# Patient Record
Sex: Male | Born: 1955 | Race: White | Hispanic: No | Marital: Single | Smoking: Never smoker
Health system: Southern US, Community
[De-identification: ages and names within clinical notes are randomized; demographics above are authoritative.]

## PROBLEM LIST (undated history)

## (undated) DIAGNOSIS — I2699 Other pulmonary embolism without acute cor pulmonale: Secondary | ICD-10-CM

## (undated) DIAGNOSIS — F79 Unspecified intellectual disabilities: Secondary | ICD-10-CM

## (undated) DIAGNOSIS — J45909 Unspecified asthma, uncomplicated: Secondary | ICD-10-CM

## (undated) DIAGNOSIS — F209 Schizophrenia, unspecified: Secondary | ICD-10-CM

---

## 2015-02-14 ENCOUNTER — Emergency Department (HOSPITAL_COMMUNITY): Payer: Medicare Other

## 2015-02-14 ENCOUNTER — Encounter (HOSPITAL_COMMUNITY): Payer: Self-pay | Admitting: *Deleted

## 2015-02-14 ENCOUNTER — Inpatient Hospital Stay (HOSPITAL_COMMUNITY)
Admission: EM | Admit: 2015-02-14 | Discharge: 2015-02-18 | DRG: 178 | Disposition: A | Discharge status: Home / Self Care | Payer: Medicare Other | Attending: Internal Medicine | Admitting: Internal Medicine

## 2015-02-14 DIAGNOSIS — F79 Unspecified intellectual disabilities: Secondary | ICD-10-CM | POA: Diagnosis present

## 2015-02-14 DIAGNOSIS — Z888 Allergy status to other drugs, medicaments and biological substances status: Secondary | ICD-10-CM | POA: Diagnosis not present

## 2015-02-14 DIAGNOSIS — Z86711 Personal history of pulmonary embolism: Secondary | ICD-10-CM | POA: Diagnosis not present

## 2015-02-14 DIAGNOSIS — Z7901 Long term (current) use of anticoagulants: Secondary | ICD-10-CM

## 2015-02-14 DIAGNOSIS — R4701 Aphasia: Secondary | ICD-10-CM | POA: Diagnosis present

## 2015-02-14 DIAGNOSIS — J189 Pneumonia, unspecified organism: Secondary | ICD-10-CM | POA: Diagnosis not present

## 2015-02-14 DIAGNOSIS — F209 Schizophrenia, unspecified: Secondary | ICD-10-CM | POA: Diagnosis present

## 2015-02-14 DIAGNOSIS — R531 Weakness: Secondary | ICD-10-CM | POA: Insufficient documentation

## 2015-02-14 DIAGNOSIS — I4581 Long QT syndrome: Secondary | ICD-10-CM | POA: Diagnosis present

## 2015-02-14 DIAGNOSIS — N179 Acute kidney failure, unspecified: Secondary | ICD-10-CM | POA: Diagnosis present

## 2015-02-14 DIAGNOSIS — I2699 Other pulmonary embolism without acute cor pulmonale: Secondary | ICD-10-CM | POA: Diagnosis present

## 2015-02-14 DIAGNOSIS — R471 Dysarthria and anarthria: Secondary | ICD-10-CM | POA: Diagnosis present

## 2015-02-14 DIAGNOSIS — R4781 Slurred speech: Secondary | ICD-10-CM | POA: Diagnosis present

## 2015-02-14 DIAGNOSIS — I714 Abdominal aortic aneurysm, without rupture, unspecified: Secondary | ICD-10-CM

## 2015-02-14 DIAGNOSIS — J45909 Unspecified asthma, uncomplicated: Secondary | ICD-10-CM | POA: Diagnosis present

## 2015-02-14 DIAGNOSIS — N200 Calculus of kidney: Secondary | ICD-10-CM | POA: Diagnosis present

## 2015-02-14 DIAGNOSIS — R109 Unspecified abdominal pain: Secondary | ICD-10-CM | POA: Diagnosis present

## 2015-02-14 DIAGNOSIS — Z885 Allergy status to narcotic agent status: Secondary | ICD-10-CM | POA: Diagnosis not present

## 2015-02-14 DIAGNOSIS — J4520 Mild intermittent asthma, uncomplicated: Secondary | ICD-10-CM

## 2015-02-14 DIAGNOSIS — J690 Pneumonitis due to inhalation of food and vomit: Principal | ICD-10-CM | POA: Diagnosis present

## 2015-02-14 DIAGNOSIS — M6289 Other specified disorders of muscle: Secondary | ICD-10-CM | POA: Diagnosis not present

## 2015-02-14 HISTORY — DX: Unspecified intellectual disabilities: F79

## 2015-02-14 HISTORY — DX: Schizophrenia, unspecified: F20.9

## 2015-02-14 HISTORY — DX: Other pulmonary embolism without acute cor pulmonale: I26.99

## 2015-02-14 HISTORY — DX: Unspecified asthma, uncomplicated: J45.909

## 2015-02-14 LAB — DIFFERENTIAL
Basophils Absolute: 0 10*3/uL (ref 0.0–0.1)
Basophils Relative: 0 %
EOS PCT: 1 %
Eosinophils Absolute: 0.1 10*3/uL (ref 0.0–0.7)
LYMPHS PCT: 21 %
Lymphs Abs: 1.6 10*3/uL (ref 0.7–4.0)
MONO ABS: 1 10*3/uL (ref 0.1–1.0)
MONOS PCT: 13 %
Neutro Abs: 5 10*3/uL (ref 1.7–7.7)
Neutrophils Relative %: 65 %

## 2015-02-14 LAB — RAPID URINE DRUG SCREEN, HOSP PERFORMED
Amphetamines: NOT DETECTED
Barbiturates: NOT DETECTED
Benzodiazepines: POSITIVE — AB
Cocaine: NOT DETECTED
OPIATES: NOT DETECTED
Tetrahydrocannabinol: NOT DETECTED

## 2015-02-14 LAB — CBC
HEMATOCRIT: 38.9 % — AB (ref 39.0–52.0)
Hemoglobin: 12.5 g/dL — ABNORMAL LOW (ref 13.0–17.0)
MCH: 29.4 pg (ref 26.0–34.0)
MCHC: 32.1 g/dL (ref 30.0–36.0)
MCV: 91.5 fL (ref 78.0–100.0)
PLATELETS: 267 10*3/uL (ref 150–400)
RBC: 4.25 MIL/uL (ref 4.22–5.81)
RDW: 14.2 % (ref 11.5–15.5)
WBC: 7.6 10*3/uL (ref 4.0–10.5)

## 2015-02-14 LAB — COMPREHENSIVE METABOLIC PANEL
ALK PHOS: 96 U/L (ref 38–126)
ALT: 10 U/L — ABNORMAL LOW (ref 17–63)
ANION GAP: 8 (ref 5–15)
AST: 16 U/L (ref 15–41)
Albumin: 2.8 g/dL — ABNORMAL LOW (ref 3.5–5.0)
BILIRUBIN TOTAL: 0.9 mg/dL (ref 0.3–1.2)
BUN: 6 mg/dL (ref 6–20)
CALCIUM: 9 mg/dL (ref 8.9–10.3)
CO2: 29 mmol/L (ref 22–32)
CREATININE: 1.33 mg/dL — AB (ref 0.61–1.24)
Chloride: 100 mmol/L — ABNORMAL LOW (ref 101–111)
GFR, EST NON AFRICAN AMERICAN: 57 mL/min — AB (ref >60)
Glucose, Bld: 116 mg/dL — ABNORMAL HIGH (ref 65–99)
Potassium: 3.5 mmol/L (ref 3.5–5.1)
SODIUM: 137 mmol/L (ref 135–145)
TOTAL PROTEIN: 6.9 g/dL (ref 6.5–8.1)

## 2015-02-14 LAB — URINALYSIS, ROUTINE W REFLEX MICROSCOPIC
Glucose, UA: NEGATIVE mg/dL
Ketones, ur: NEGATIVE mg/dL
LEUKOCYTES UA: NEGATIVE
NITRITE: NEGATIVE
PROTEIN: NEGATIVE mg/dL
SPECIFIC GRAVITY, URINE: 1.031 — AB (ref 1.005–1.030)
UROBILINOGEN UA: 2 mg/dL — AB (ref 0.0–1.0)
pH: 6.5 (ref 5.0–8.0)

## 2015-02-14 LAB — I-STAT TROPONIN, ED: TROPONIN I, POC: 0 ng/mL (ref 0.00–0.08)

## 2015-02-14 LAB — I-STAT CG4 LACTIC ACID, ED
Lactic Acid, Venous: 1.2 mmol/L (ref 0.5–2.0)
Lactic Acid, Venous: 1.73 mmol/L (ref 0.5–2.0)

## 2015-02-14 LAB — URINE MICROSCOPIC-ADD ON

## 2015-02-14 LAB — I-STAT CHEM 8, ED
BUN: 7 mg/dL (ref 6–20)
CALCIUM ION: 1.2 mmol/L (ref 1.12–1.23)
Chloride: 99 mmol/L — ABNORMAL LOW (ref 101–111)
Creatinine, Ser: 1.3 mg/dL — ABNORMAL HIGH (ref 0.61–1.24)
GLUCOSE: 115 mg/dL — AB (ref 65–99)
HCT: 40 % (ref 39.0–52.0)
Hemoglobin: 13.6 g/dL (ref 13.0–17.0)
Potassium: 3.7 mmol/L (ref 3.5–5.1)
SODIUM: 141 mmol/L (ref 135–145)
TCO2: 28 mmol/L (ref 0–100)

## 2015-02-14 LAB — PROTIME-INR
INR: 2.93 — ABNORMAL HIGH (ref 0.00–1.49)
PROTHROMBIN TIME: 30.1 s — AB (ref 11.6–15.2)

## 2015-02-14 LAB — ETHANOL

## 2015-02-14 LAB — APTT: aPTT: 46 seconds — ABNORMAL HIGH (ref 24–37)

## 2015-02-14 MED ORDER — PIPERACILLIN-TAZOBACTAM 3.375 G IVPB 30 MIN
3.3750 g | Freq: Once | INTRAVENOUS | Status: AC
  Administered 2015-02-14: 3.375 g via INTRAVENOUS
  Filled 2015-02-14: qty 50

## 2015-02-14 MED ORDER — VANCOMYCIN HCL IN DEXTROSE 1-5 GM/200ML-% IV SOLN
1000.0000 mg | Freq: Once | INTRAVENOUS | Status: AC
  Administered 2015-02-14: 1000 mg via INTRAVENOUS
  Filled 2015-02-14: qty 200

## 2015-02-14 MED ORDER — SODIUM CHLORIDE 0.9 % IV BOLUS (SEPSIS)
1000.0000 mL | Freq: Once | INTRAVENOUS | Status: AC
  Administered 2015-02-14: 1000 mL via INTRAVENOUS

## 2015-02-14 MED ORDER — IOHEXOL 350 MG/ML SOLN
100.0000 mL | Freq: Once | INTRAVENOUS | Status: AC | PRN
  Administered 2015-02-14: 100 mL via INTRAVENOUS

## 2015-02-14 NOTE — ED Notes (Signed)
Patient transported to CT 

## 2015-02-14 NOTE — ED Notes (Signed)
Attempted to call report

## 2015-02-14 NOTE — H&P (Signed)
[facility] History and Physical  Albert Donovan ZOX:[id number] DOB: [birth year] DOA: 02/14/2015  Referring physician: ED physician PCP: No primary care provider on file.  Specialists:   Chief Complaint: Right arm weakness, slurred speech, shortness of breath, abdominal pain, low  blood pressure  HPI: Albert Donovan is a 59 y.o. male with PMH of mental retardation, asthma, schizophrenia, PE, who presents with right arm weakness, slurred speech, shortness of breath, abdominal pain, low blood pressure.  Patient has mental retardation, and is unable to provide accurate medical history, therefore, most of the history is obtained by discussing the case with ED physician, his brother and group home supervisor, Ms. Laural Benes.  Per Mr. Laural Benes, patient was recently hospitalized to [facility] from 9/1-9/15 for aspiration pneumonia. Patient was treated with antibiotics, and discharged back to group home facility. Patient was supposed to continue to take doxycycline orally, but doctor forgot to prescribe it at discharge. He just got doxycycline prescription today and started taking oral doxycycline today. Per Mr. Laural Benes, pt still has shortness of breath during the past 7 days, but no chest pain or cough. No fever or chills.  Patient was also noted to have slurred speech, and right arm weakness in the past week. His slurred speech has been persistent in the past 7 days, but the right arm weakness has almost resolved now. He complains of abdominal pain in the past 2 days, which is located in both left and right upper quadrant, worse on the left upper quadrant. No diarrhea or constipation. No symptoms of UTI. Of note, patient had hx of PE, he was on Xarelto which was discontinued for 1 week during previous admission for thoracentesis. His Xarelto was restarted today.  In ED, patient was found to have an INR 2.93, PTT 46, lactate 1.73, WBC 7.6, tachycardia, AKI, normal temperature. Negative CT head of  acute abnormalities. CTA of chest showed no evidence of significant pulmonary embolus. No evidence of aortic dissection, but showed consolidation in the right lung base with small pleural effusion, possibly pneumonia. CT-abdomen/pelvis showed nonobstructing stone in the left kidney. Diffuse bladder wall thickening suggesting cystitis. No hydronephrosis. Urinalysis and urine culture pending. Patient was admitted to inpatient for further reevaluation and treatment.  Where does patient live?   Group home Can patient participate in ADLs?   None  Review of Systems: Could not be obtained due to mental retardation.  Allergy:  Allergies  Allergen Reactions  . Trifluoperazine Hcl   . Tramadol     Past Medical History  Diagnosis Date  . PE (pulmonary embolism)   . MR (mental retardation)   . Schizophrenia   . Asthma     History reviewed. No pertinent past surgical history.  Social History:  reports that he has never smoked. He does not have any smokeless tobacco history on file. His alcohol and drug histories are not on file.  Family History:  Family History  Problem Relation Age of Onset  . Stroke Mother   . Prostate cancer Father   . Deep vein thrombosis Brother      Prior to Admission medications   Not on File    Physical Exam: Filed Vitals:   02/14/15 2100 02/14/15 2200 02/14/15 2230 02/15/15 0000  BP: 110/71 102/54 102/55   Pulse: 99 93 100   Temp:      Resp: Height:    6' (1.829 m)  Weight:    122.925 kg (271 lb)  SpO2: 98% 97% 97%  General: Not in acute distress HEENT:       Eyes: PERRL, EOMI, no scleral icterus.       ENT: No discharge from the ears and nose, no pharynx injection, no tonsillar enlargement.        Neck: No JVD, no bruit, no mass felt. Heme: No neck lymph node enlargement. Cardiac: S1/S2, RRR, No murmurs, No gallops or rubs. Pulm: No rales, wheezing, rhonchi or rubs. Abd: Soft, nondistended, tenderness over LUQ, no rebound pain, no  organomegaly, BS present. Ext: No pitting leg edema bilaterally. 2+DP/PT pulse bilaterally. Musculoskeletal: No joint deformities, No joint redness or warmth, no limitation of ROM in spin. Skin: No rashes.  Neuro: Alert, not oriented X3, cranial nerves II-XII grossly intact, muscle strength 5/5 in all extremities, sensation to light touch intact. Brachial reflex 1+ bilaterally. Knee reflex 1+ bilaterally. Negative Babinski's sign. Normal finger to nose test. Psych: Patient is not psychotic, no suicidal or hemocidal ideation.  Labs on Admission:  Basic Metabolic Panel:  Recent Labs Lab 02/14/15 1951 02/14/15 2004  NA 141 137  K 3.7 3.5  CL 99* 100*  CO2  --  29  GLUCOSE 115* 116*  BUN 7 6  CREATININE 1.30* 1.33*  CALCIUM  --  9.0   Liver Function Tests:  Recent Labs Lab 02/14/15 2004  AST 16  ALT 10*  ALKPHOS 96  BILITOT 0.9  PROT 6.9  ALBUMIN 2.8*    Recent Labs Lab 02/15/15 0126  LIPASE 38   No results for input(s): AMMONIA in the last 168 hours. CBC:  Recent Labs Lab 02/14/15 1951 02/14/15 2004  WBC  --  7.6  NEUTROABS  --  5.0  HGB 13.6 12.5*  HCT 40.0 38.9*  MCV  --  91.5  PLT  --  267   Cardiac Enzymes: No results for input(s): CKTOTAL, CKMB, CKMBINDEX, TROPONINI in the last 168 hours.  BNP (last 3 results) No results for input(s): BNP in the last 8760 hours.  ProBNP (last 3 results) No results for input(s): PROBNP in the last 8760 hours.  CBG: No results for input(s): GLUCAP in the last 168 hours.  Radiological Exams on Admission: Ct Head Wo Contrast  02/14/2015   CLINICAL DATA:  59 yr old male with worsening slurred speech, intermittent right sided weakness, generalized fatigue for the past week, also elevated HR, low BP.  EXAM: CT HEAD WITHOUT CONTRAST  TECHNIQUE: Contiguous axial images were obtained from the base of the skull through the vertex without intravenous contrast.  COMPARISON:  None.  FINDINGS: Ventricles are normal  configuration. There is ventricular and sulcal enlargement reflecting volume loss somewhat greater than generally seen in this patient's age group. There is no hydrocephalus.  There are no parenchymal masses or mass effect. There is no evidence of a cortical infarct.  There are no extra-axial masses or abnormal fluid collections.  There is no intracranial hemorrhage.  Visualized sinuses and mastoid air cells are clear. No skull lesion.  IMPRESSION: 1. No acute intracranial abnormalities. 2. Volume loss somewhat greater than expected for age. No other abnormality.   Electronically Signed   By: Amie Portland M.D.   On: 02/14/2015 20:11   Ct Angio Chest Pe W/cm &/or Wo Cm  02/14/2015   CLINICAL DATA:  Abdominal pain radiating into the back.  EXAM: CT ANGIOGRAPHY CHEST  CT ABDOMEN AND PELVIS WITH CONTRAST  TECHNIQUE: Multidetector CT imaging of the chest was performed using the standard protocol during bolus administration of intravenous  contrast. Multiplanar CT image reconstructions and MIPs were obtained to evaluate the vascular anatomy. Multidetector CT imaging of the abdomen and pelvis was performed using the standard protocol during bolus administration of intravenous contrast.  CONTRAST:  OMNIPAQUE IOHEXOL 350 MG/ML SOLN  COMPARISON:  None.  FINDINGS: CTA CHEST FINDINGS  Technically adequate study with moderately good opacification of the central and proximal segmental pulmonary arteries. No focal filling defects demonstrated. No evidence of significant pulmonary embolus.  Normal heart size. Normal caliber thoracic aorta. No evidence of aortic dissection. Mild calcification in the aorta. Great vessel origins are patent. Mediastinal lymph nodes are not pathologically enlarged. Esophagus is decompressed. Small esophageal hiatal hernia.  Small right pleural effusion with basilar infiltration and volume loss, possibly pneumonia. Mild dependent changes in the left lung base. No pneumothorax.  CT ABDOMEN and  PELVIS FINDINGS  The liver, spleen, gallbladder, pancreas, adrenal glands, abdominal aorta, inferior vena cava, and retroperitoneal lymph nodes are unremarkable. Sub cm low-attenuation lesion in the right kidney probably represents a cyst although too small to characterize. 2 mm stone in the lower pole left kidney. No hydronephrosis or hydroureter. Stomach, small bowel, and colon are not abnormally distended. No free air or free fluid in the abdomen.  Pelvis: Appendix is not identified. Bladder wall is diffusely thickened possibly suggesting cystitis. Prostate gland is not enlarged. No free or loculated pelvic fluid collections. No pelvic mass or lymphadenopathy. No evidence of diverticulitis. Degenerative changes in the spine. No destructive bone lesions.  Review of the MIP images confirms the above findings.  IMPRESSION: No evidence of significant pulmonary embolus. No evidence of aortic dissection. Consolidation in the right lung base with small pleural effusion, possibly pneumonia.  Nonobstructing stone in the left kidney. Diffuse bladder wall thickening suggesting cystitis. No evidence of bowel obstruction or inflammation.   Electronically Signed   By: Burman Nieves M.D.   On: 02/14/2015 22:08   Ct Abdomen Pelvis W Contrast  02/14/2015   CLINICAL DATA:  Abdominal pain radiating into the back.  EXAM: CT ANGIOGRAPHY CHEST  CT ABDOMEN AND PELVIS WITH CONTRAST  TECHNIQUE: Multidetector CT imaging of the chest was performed using the standard protocol during bolus administration of intravenous contrast. Multiplanar CT image reconstructions and MIPs were obtained to evaluate the vascular anatomy. Multidetector CT imaging of the abdomen and pelvis was performed using the standard protocol during bolus administration of intravenous contrast.  CONTRAST:  OMNIPAQUE IOHEXOL 350 MG/ML SOLN  COMPARISON:  None.  FINDINGS: CTA CHEST FINDINGS  Technically adequate study with moderately good opacification of the  central and proximal segmental pulmonary arteries. No focal filling defects demonstrated. No evidence of significant pulmonary embolus.  Normal heart size. Normal caliber thoracic aorta. No evidence of aortic dissection. Mild calcification in the aorta. Great vessel origins are patent. Mediastinal lymph nodes are not pathologically enlarged. Esophagus is decompressed. Small esophageal hiatal hernia.  Small right pleural effusion with basilar infiltration and volume loss, possibly pneumonia. Mild dependent changes in the left lung base. No pneumothorax.  CT ABDOMEN and PELVIS FINDINGS  The liver, spleen, gallbladder, pancreas, adrenal glands, abdominal aorta, inferior vena cava, and retroperitoneal lymph nodes are unremarkable. Sub cm low-attenuation lesion in the right kidney probably represents a cyst although too small to characterize. 2 mm stone in the lower pole left kidney. No hydronephrosis or hydroureter. Stomach, small bowel, and colon are not abnormally distended. No free air or free fluid in the abdomen.  Pelvis: Appendix is not identified. Bladder wall  is diffusely thickened possibly suggesting cystitis. Prostate gland is not enlarged. No free or loculated pelvic fluid collections. No pelvic mass or lymphadenopathy. No evidence of diverticulitis. Degenerative changes in the spine. No destructive bone lesions.  Review of the MIP images confirms the above findings.  IMPRESSION: No evidence of significant pulmonary embolus. No evidence of aortic dissection. Consolidation in the right lung base with small pleural effusion, possibly pneumonia.  Nonobstructing stone in the left kidney. Diffuse bladder wall thickening suggesting cystitis. No evidence of bowel obstruction or inflammation.   Electronically Signed   By: Burman Nieves M.D.   On: 02/14/2015 22:08    EKG: Independently reviewed.  Abnormal findings:  QTC 616, low voltage, tachycardia, T-wave flattening Assessment/Plan Principal Problem:    Aspiration pneumonia Active Problems:   PE (pulmonary embolism)   MR (mental retardation)   Schizophrenia   Asthma   Weakness   Slurred speech   Abdominal pain   AKI (acute kidney injury)  Aspiration pneumonia: Patient was recently treated for aspiration pneumonia. He still has shortness of breath with positive consolidations on CTA. Patient is not septic on admission. hemadynamic is stable. He was supposed to continue to take doxycycline, but has not been taking unitl today due to mistake. - will admit to tele bed - ED started Vancomycin and Zosyn, will continue. May narrow down quickly if no signs of worsening. - Mucinex for cough  - Albuterol Neb prn for SOB - Urine legionella and S. pneumococcal antigen - Follow up blood culture x2, sputum culture and plus Flu pcr - will get Procalcitonin and trend lactic acid level - IVF: 1L of NS bolus in ED, followed by 100 mL per hour of NS - NPO until SLP done  PE (pulmonary embolism): No new PE by CTA - Continue Xarelto   MR (mental retardation) and schizophrenia: No acute issues. Stable. - no med. Observe closely  Slurred speech and right arm weakness: CT head is negative. Right arm weakness has almost resolved. Neurology was consulted. Dr. Cyril Mourning evaluated the patient. Recommend MRI. -Appreciate Dr. Cheral Marker consultation -f/u MRI-brain -start ASA  Abdominal pain: Etiology is not clear. CT abdomen/pelvis is not impressive, but showed possible cystitis. -Follow-up urinalysis and urine culture. -Check lipase -Patient is already on IV antibiotics -When necessary morphine for pain  AKI (acute kidney injury): Creatinine 1.33, BUN 6. No hydronephrosis by CT-abdomen/pelvis. Likely due to prerenal secondary to dehydration. - IVF as above - Check FeNa  - Follow up renal function by BMP   DVT ppx: on Xarelto Code Status: Full code Family Communication:  Yes, patient's brother and group home supervisor, Ms. Laural Benes at bed  side Disposition Plan: Admit to inpatient   Date of Service 02/15/2015    Lorretta Harp [facility] Pager [phone]  If 7PM-7AM, please contact night-coverage [email or web address] Password [facility] 02/15/2015, 1:43 AM

## 2015-02-14 NOTE — ED Notes (Signed)
Pt unable to provide urine specimen at this time

## 2015-02-14 NOTE — ED Provider Notes (Signed)
CSN: [id number]     Arrival date & time 02/14/15  1827 History   First MD Initiated Contact with Patient 02/14/15 1850     Chief Complaint  Patient presents with  . Aphasia  . Weakness     (Consider location/radiation/quality/duration/timing/severity/associated sxs/prior Treatment) The history is provided by the EMS personnel and the nursing home.  Lalo Tromp is a 59 y.o. male hx of mental retardation, Schizophrenia, pulmonary embolism, who presenting with fatigue, slurred speech, abdominal pain, shortness of breath. Patient was recently admitted to [facility] and was found to have bilateral pleural effusions and underwent thoracentesis. He also finished a course of antibiotics. Moreover, his xarelto was stopped for more than a week and just restarted today. He also had slurred speech upon discharge from the hospital on 9/15. He was sent back to the hospital and was told that he didn't have a stroke. His slurred speech was progressively worsening. Has intermittent shortness of breath and cough as well. Denies fevers. Has intermittent LLQ pain as well. Denies vomiting. Saw PMD today and was noted to be hypotensive 80/60. Difficult historian due to slurred speech.     Level V caveat- mental retardation, slurred speech   Past Medical History  Diagnosis Date  . PE (pulmonary embolism)   . MR (mental retardation)   . Schizophrenia   . Asthma    History reviewed. No pertinent past surgical history. History reviewed. No pertinent family history. Social History  Substance Use Topics  . Smoking status: Never Smoker   . Smokeless tobacco: None  . Alcohol Use: None    Review of Systems  Respiratory: Positive for cough and shortness of breath.   Gastrointestinal: Positive for abdominal pain.  Neurological: Positive for speech difficulty and weakness.  All other systems reviewed and are negative.     Allergies  Review of patient's allergies indicates not on file.  Home  Medications   Prior to Admission medications   Not on File   BP 110/71 mmHg  Pulse 99  Temp(Src) 98 F (36.7 C)  Resp 14  SpO2 98% Physical Exam  Constitutional:  Chronically ill, slurred speech   HENT:  Head: Normocephalic.  Mouth/Throat: Oropharynx is clear and moist.  Eyes: Conjunctivae are normal. Pupils are equal, round, and reactive to light.  Neck: Normal range of motion. Neck supple.  Cardiovascular: Regular rhythm and normal heart sounds.   Tachycardic   Pulmonary/Chest: Effort normal and breath sounds normal. No respiratory distress. He has no wheezes. He has no rales.  Abdominal: Soft. Bowel sounds are normal.  Minimal L sided ab tenderness   Musculoskeletal: Normal range of motion.  Neurological: He is alert.  Slurred speech. Nl strength throughout.   Skin: Skin is warm and dry.  Psychiatric: He has a normal mood and affect. His behavior is normal. Thought content normal.  Nursing note and vitals reviewed.   ED Course  Procedures (including critical care time) Labs Review Labs Reviewed  PROTIME-INR - Abnormal; Notable for the following:    Prothrombin Time 30.1 (*)    INR 2.93 (*)    All other components within normal limits  APTT - Abnormal; Notable for the following:    aPTT 46 (*)    All other components within normal limits  CBC - Abnormal; Notable for the following:    Hemoglobin 12.5 (*)    HCT 38.9 (*)    All other components within normal limits  COMPREHENSIVE METABOLIC PANEL - Abnormal; Notable for the following:  Chloride 100 (*)    Glucose, Bld 116 (*)    Creatinine, Ser 1.33 (*)    Albumin 2.8 (*)    ALT 10 (*)    GFR calc non Af Amer 57 (*)    All other components within normal limits  I-STAT CHEM 8, ED - Abnormal; Notable for the following:    Chloride 99 (*)    Creatinine, Ser 1.30 (*)    Glucose, Bld 115 (*)    All other components within normal limits  CULTURE, BLOOD (ROUTINE X 2)  CULTURE, BLOOD (ROUTINE X 2)  ETHANOL   DIFFERENTIAL  URINE RAPID DRUG SCREEN, HOSP PERFORMED  URINALYSIS, ROUTINE W REFLEX MICROSCOPIC (NOT AT [facility])  I-STAT TROPOININ, ED  I-STAT CG4 LACTIC ACID, ED  I-STAT CG4 LACTIC ACID, ED    Imaging Review Ct Head Wo Contrast  02/14/2015   CLINICAL DATA:  59 yr old male with worsening slurred speech, intermittent right sided weakness, generalized fatigue for the past week, also elevated HR, low BP.  EXAM: CT HEAD WITHOUT CONTRAST  TECHNIQUE: Contiguous axial images were obtained from the base of the skull through the vertex without intravenous contrast.  COMPARISON:  None.  FINDINGS: Ventricles are normal configuration. There is ventricular and sulcal enlargement reflecting volume loss somewhat greater than generally seen in this patient's age group. There is no hydrocephalus.  There are no parenchymal masses or mass effect. There is no evidence of a cortical infarct.  There are no extra-axial masses or abnormal fluid collections.  There is no intracranial hemorrhage.  Visualized sinuses and mastoid air cells are clear. No skull lesion.  IMPRESSION: 1. No acute intracranial abnormalities. 2. Volume loss somewhat greater than expected for age. No other abnormality.   Electronically Signed   By: Amie Portland M.D.   On: 02/14/2015 20:11   Ct Angio Chest Pe W/cm &/or Wo Cm  02/14/2015   CLINICAL DATA:  Abdominal pain radiating into the back.  EXAM: CT ANGIOGRAPHY CHEST  CT ABDOMEN AND PELVIS WITH CONTRAST  TECHNIQUE: Multidetector CT imaging of the chest was performed using the standard protocol during bolus administration of intravenous contrast. Multiplanar CT image reconstructions and MIPs were obtained to evaluate the vascular anatomy. Multidetector CT imaging of the abdomen and pelvis was performed using the standard protocol during bolus administration of intravenous contrast.  CONTRAST:  OMNIPAQUE IOHEXOL 350 MG/ML SOLN  COMPARISON:  None.  FINDINGS: CTA CHEST FINDINGS  Technically adequate  study with moderately good opacification of the central and proximal segmental pulmonary arteries. No focal filling defects demonstrated. No evidence of significant pulmonary embolus.  Normal heart size. Normal caliber thoracic aorta. No evidence of aortic dissection. Mild calcification in the aorta. Great vessel origins are patent. Mediastinal lymph nodes are not pathologically enlarged. Esophagus is decompressed. Small esophageal hiatal hernia.  Small right pleural effusion with basilar infiltration and volume loss, possibly pneumonia. Mild dependent changes in the left lung base. No pneumothorax.  CT ABDOMEN and PELVIS FINDINGS  The liver, spleen, gallbladder, pancreas, adrenal glands, abdominal aorta, inferior vena cava, and retroperitoneal lymph nodes are unremarkable. Sub cm low-attenuation lesion in the right kidney probably represents a cyst although too small to characterize. 2 mm stone in the lower pole left kidney. No hydronephrosis or hydroureter. Stomach, small bowel, and colon are not abnormally distended. No free air or free fluid in the abdomen.  Pelvis: Appendix is not identified. Bladder wall is diffusely thickened possibly suggesting cystitis. Prostate gland is not enlarged.  No free or loculated pelvic fluid collections. No pelvic mass or lymphadenopathy. No evidence of diverticulitis. Degenerative changes in the spine. No destructive bone lesions.  Review of the MIP images confirms the above findings.  IMPRESSION: No evidence of significant pulmonary embolus. No evidence of aortic dissection. Consolidation in the right lung base with small pleural effusion, possibly pneumonia.  Nonobstructing stone in the left kidney. Diffuse bladder wall thickening suggesting cystitis. No evidence of bowel obstruction or inflammation.   Electronically Signed   By: Burman Nieves M.D.   On: 02/14/2015 22:08   Ct Abdomen Pelvis W Contrast  02/14/2015   CLINICAL DATA:  Abdominal pain radiating into the back.   EXAM: CT ANGIOGRAPHY CHEST  CT ABDOMEN AND PELVIS WITH CONTRAST  TECHNIQUE: Multidetector CT imaging of the chest was performed using the standard protocol during bolus administration of intravenous contrast. Multiplanar CT image reconstructions and MIPs were obtained to evaluate the vascular anatomy. Multidetector CT imaging of the abdomen and pelvis was performed using the standard protocol during bolus administration of intravenous contrast.  CONTRAST:  OMNIPAQUE IOHEXOL 350 MG/ML SOLN  COMPARISON:  None.  FINDINGS: CTA CHEST FINDINGS  Technically adequate study with moderately good opacification of the central and proximal segmental pulmonary arteries. No focal filling defects demonstrated. No evidence of significant pulmonary embolus.  Normal heart size. Normal caliber thoracic aorta. No evidence of aortic dissection. Mild calcification in the aorta. Great vessel origins are patent. Mediastinal lymph nodes are not pathologically enlarged. Esophagus is decompressed. Small esophageal hiatal hernia.  Small right pleural effusion with basilar infiltration and volume loss, possibly pneumonia. Mild dependent changes in the left lung base. No pneumothorax.  CT ABDOMEN and PELVIS FINDINGS  The liver, spleen, gallbladder, pancreas, adrenal glands, abdominal aorta, inferior vena cava, and retroperitoneal lymph nodes are unremarkable. Sub cm low-attenuation lesion in the right kidney probably represents a cyst although too small to characterize. 2 mm stone in the lower pole left kidney. No hydronephrosis or hydroureter. Stomach, small bowel, and colon are not abnormally distended. No free air or free fluid in the abdomen.  Pelvis: Appendix is not identified. Bladder wall is diffusely thickened possibly suggesting cystitis. Prostate gland is not enlarged. No free or loculated pelvic fluid collections. No pelvic mass or lymphadenopathy. No evidence of diverticulitis. Degenerative changes in the spine. No destructive  bone lesions.  Review of the MIP images confirms the above findings.  IMPRESSION: No evidence of significant pulmonary embolus. No evidence of aortic dissection. Consolidation in the right lung base with small pleural effusion, possibly pneumonia.  Nonobstructing stone in the left kidney. Diffuse bladder wall thickening suggesting cystitis. No evidence of bowel obstruction or inflammation.   Electronically Signed   By: Burman Nieves M.D.   On: 02/14/2015 22:08   I have personally reviewed and evaluated these images and lab results as part of my medical decision-making.   EKG Interpretation   Date/Time:  Thursday February 14 2015 18:34:57 EDT Ventricular Rate:  129 PR Interval:    QRS Duration: 82 QT Interval:  408 QTC Calculation: 597 R Axis:   -38 Text Interpretation:  sinus tachycardia  Left axis deviation Low voltage  QRS Cannot rule out Anterior infarct , age undetermined Abnormal ECG No  previous ECGs available Confirmed by YAO  MD, DAVID ([id number]) on 02/14/2015  6:50:03 PM      MDM   Final diagnoses:  AAA (abdominal aortic aneurysm)    Petar Mucci is a 59 y.o. male here with  worsening slurred speech, shortness of breath, ab pain, intermittent hypotension. Consider sepsis from recurrent pneumonia vs UTI vs PE (patient off xarelto for more than a week) vs AAA vs stroke. Will get labs, CT angio chest/ab/pel, sepsis workup.   10:52 PM Patient fail swallow eval. CT head showed no acute infarct. Symptoms for a week, consulted neuro for further workup. CT showed pneumonia, no PE. Given IV abx for HCAP. CT ab/pel also showed possible cystitis. Will admit.     Richardean Canal, MD 02/14/15 [phone]

## 2015-02-14 NOTE — Consult Note (Signed)
Referring Physician: Dr Darl Householder    Chief Complaint: worsening dysarthria, intermittent right sided weakness   HPI:                                                                                                                                         Albert Donovan is an 59 y.o. male with a past medical history significant for MR, schizophrenia, asthma, PE, recently admitted to [facility] due to bilateral pleural effusions and underwent thoracentesis, lives at a nursing home, and was brought to this ED for evaluation of the above mentioned symptoms. He is a poor historian. Family is at the bedside and report that Albert Donovan was off xarelto for almost 2 weeks due to his recent surgical procedure and resumed taking it today. As per family, he has some slurred speech at baseline that has been getting progressively worse since discharge from [facility], to the point that family and nursing home staff can barely understand what he is trying to say. He was sent back to the hospital and was told that he didn't have a stroke Furthermore, he has been noted to have intermittent weakness of the right side. Patient denies HA, vertigo, double vision, or vision disturbances. No reported confusion or falls. Denies fevers. Has intermittent LLQ pain as well. CT brain performed tonight was personally reviewed and showed no acute abnormality. CTA chest: no PE, consolidation in the right lung base with small pleural effusion, possibly pneumonia. Serologies were reviewed: UDS negative, etoh  < 5, wbc 7.6, Cr 1.33, Na 137, lactic acid 1.20 ,PTT 46, INR 2.93  Date last known well: unable to determine Time last known well: unable to determine tPA Given: no, late presentation   Past Medical History  Diagnosis Date  . PE (pulmonary embolism)   . MR (mental retardation)   . Schizophrenia   . Asthma     History reviewed. No pertinent past surgical history.  History reviewed. No pertinent family history. Social History:  reports  that he has never smoked. He does not have any smokeless tobacco history on file. His alcohol and drug histories are not on file.  Allergies: Not on File  Medications:                                                                                                                           I have reviewed  the patient's current medications.  ROS:                                                                                                                                       History obtained from chart review and the patient  General ROS: negative for - chills, fever, night sweats,  or weight loss Psychological ROS: negative for -  memory difficulties, or suicidal ideation Ophthalmic ROS: negative for - blurry vision, double vision, eye pain or loss of vision ENT ROS: negative for - epistaxis, nasal discharge, oral lesions, sore throat, tinnitus or vertigo Allergy and Immunology ROS: negative for - hives or itchy/watery eyes Hematological and Lymphatic ROS: negative for - bleeding problems, bruising or swollen lymph nodes Endocrine ROS: negative for - galactorrhea, hair pattern changes, polydipsia/polyuria or temperature intolerance Respiratory ROS: negative for - cough, hemoptysis, or wheezing Cardiovascular ROS: negative for - chest pain, edema or irregular heartbeat Gastrointestinal ROS: negative for - diarrhea, hematemesis, or stool incontinence Genito-Urinary ROS: negative for - dysuria, hematuria, incontinence or urinary frequency/urgency Musculoskeletal ROS: negative for - joint swelling Neurological ROS: as noted in HPI Dermatological ROS: negative for rash and skin lesion changes   Physical exam:  Constitutional: well developed, pleasant male in no apparent distress. Blood pressure 110/71, pulse 99, temperature 98 F (36.7 C), resp. rate 14, SpO2 98 %. Eyes: no jaundice or exophthalmos.  Head: normocephalic. Neck: supple, no bruits, no JVD. Cardiac: no murmurs. Lungs:  clear. Abdomen: soft, no tender, no mass. Extremities: no edema, clubbing, or cyanosis.  Skin: no rash   Neurologic Examination:                                                                                                      General: NAD Mental Status: Alert, oriented, thought content appropriate.  Marked dysarthria without evidence of aphasia.  Able to follow 3 step commands without difficulty. Cranial Nerves: II: Discs flat bilaterally; Visual fields grossly normal, pupils equal, round, reactive to light and accommodation III,IV, VI: ptosis not present, extra-ocular motions intact bilaterally V,VII: smile symmetric, facial light touch sensation normal bilaterally VIII: hearing normal bilaterally IX,X: uvula rises symmetrically XI: bilateral shoulder shrug XII: midline tongue extension without atrophy or fasciculations  Motor: Right : Upper extremity   5/5    Left:     Upper extremity   5/5  Lower extremity   5/5     Lower extremity   5/5 Tone and bulk:normal tone throughout; no atrophy  noted Sensory: Pinprick and light touch intact throughout, bilaterally Deep Tendon Reflexes:  1+ all over Plantars: Right: downgoing   Left: downgoing Cerebellar: normal finger-to-nose,  normal heel-to-shin test Gait: No tested due to multiple leads t    Results for orders placed or performed during the hospital encounter of 02/14/15 (from the past 48 hour(s))  I-stat troponin, ED (not at [facility], [facility])     Status: None   Collection Time: 02/14/15  7:49 PM  Result Value Ref Range   Troponin i, poc 0.00 0.00 - 0.08 ng/mL   Comment 3            Comment: Due to the release kinetics of cTnI, a negative result within the first hours of the onset of symptoms does not rule out myocardial infarction with certainty. If myocardial infarction is still suspected, repeat the test at appropriate intervals.   I-Stat Chem 8, ED  (not at [facility], [facility])     Status: Abnormal   Collection Time: 02/14/15   7:51 PM  Result Value Ref Range   Sodium 141 135 - 145 mmol/L   Potassium 3.7 3.5 - 5.1 mmol/L   Chloride 99 (L) 101 - 111 mmol/L   BUN 7 6 - 20 mg/dL   Creatinine, Ser 1.30 (H) 0.61 - 1.24 mg/dL   Glucose, Bld 115 (H) 65 - 99 mg/dL   Calcium, Ion 1.20 1.12 - 1.23 mmol/L   TCO2 28 0 - 100 mmol/L   Hemoglobin 13.6 13.0 - 17.0 g/dL   HCT 40.0 39.0 - 52.0 %  I-Stat CG4 Lactic Acid, ED     Status: None   Collection Time: 02/14/15  7:51 PM  Result Value Ref Range   Lactic Acid, Venous 1.20 0.5 - 2.0 mmol/L  Ethanol     Status: None   Collection Time: 02/14/15  8:03 PM  Result Value Ref Range   Alcohol, Ethyl (B) <5 <5 mg/dL    Comment:        LOWEST DETECTABLE LIMIT FOR SERUM ALCOHOL IS 5 mg/dL FOR MEDICAL PURPOSES ONLY   Protime-INR     Status: Abnormal   Collection Time: 02/14/15  8:04 PM  Result Value Ref Range   Prothrombin Time 30.1 (H) 11.6 - 15.2 seconds   INR 2.93 (H) 0.00 - 1.49  APTT     Status: Abnormal   Collection Time: 02/14/15  8:04 PM  Result Value Ref Range   aPTT 46 (H) 24 - 37 seconds    Comment:        IF BASELINE aPTT IS ELEVATED, SUGGEST PATIENT RISK ASSESSMENT BE USED TO DETERMINE APPROPRIATE ANTICOAGULANT THERAPY.   CBC     Status: Abnormal   Collection Time: 02/14/15  8:04 PM  Result Value Ref Range   WBC 7.6 4.0 - 10.5 K/uL   RBC 4.25 4.22 - 5.81 MIL/uL   Hemoglobin 12.5 (L) 13.0 - 17.0 g/dL   HCT 38.9 (L) 39.0 - 52.0 %   MCV 91.5 78.0 - 100.0 fL   MCH 29.4 26.0 - 34.0 pg   MCHC 32.1 30.0 - 36.0 g/dL   RDW 14.2 11.5 - 15.5 %   Platelets 267 150 - 400 K/uL  Differential     Status: None   Collection Time: 02/14/15  8:04 PM  Result Value Ref Range   Neutrophils Relative % 65 %   Neutro Abs 5.0 1.7 - 7.7 K/uL   Lymphocytes Relative 21 %   Lymphs Abs 1.6 0.7 - 4.0 K/uL  Monocytes Relative 13 %   Monocytes Absolute 1.0 0.1 - 1.0 K/uL   Eosinophils Relative 1 %   Eosinophils Absolute 0.1 0.0 - 0.7 K/uL   Basophils Relative 0 %    Basophils Absolute 0.0 0.0 - 0.1 K/uL  Comprehensive metabolic panel     Status: Abnormal   Collection Time: 02/14/15  8:04 PM  Result Value Ref Range   Sodium 137 135 - 145 mmol/L   Potassium 3.5 3.5 - 5.1 mmol/L   Chloride 100 (L) 101 - 111 mmol/L   CO2 29 22 - 32 mmol/L   Glucose, Bld 116 (H) 65 - 99 mg/dL   BUN 6 6 - 20 mg/dL   Creatinine, Ser 1.33 (H) 0.61 - 1.24 mg/dL   Calcium 9.0 8.9 - 10.3 mg/dL   Total Protein 6.9 6.5 - 8.1 g/dL   Albumin 2.8 (L) 3.5 - 5.0 g/dL   AST 16 15 - 41 U/L   ALT 10 (L) 17 - 63 U/L   Alkaline Phosphatase 96 38 - 126 U/L   Total Bilirubin 0.9 0.3 - 1.2 mg/dL   GFR calc non Af Amer 57 (L) >60 mL/min   GFR calc Af Amer >60 >60 mL/min    Comment: (NOTE) The eGFR has been calculated using the CKD EPI equation. This calculation has not been validated in all clinical situations. eGFR's persistently <60 mL/min signify possible Chronic Kidney Disease.    Anion gap 8 5 - 15  I-Stat CG4 Lactic Acid, ED     Status: None   Collection Time: 02/14/15 10:40 PM  Result Value Ref Range   Lactic Acid, Venous 1.73 0.5 - 2.0 mmol/L   Ct Head Wo Contrast  02/14/2015   CLINICAL DATA:  59 yr old male with worsening slurred speech, intermittent right sided weakness, generalized fatigue for the past week, also elevated HR, low BP.  EXAM: CT HEAD WITHOUT CONTRAST  TECHNIQUE: Contiguous axial images were obtained from the base of the skull through the vertex without intravenous contrast.  COMPARISON:  None.  FINDINGS: Ventricles are normal configuration. There is ventricular and sulcal enlargement reflecting volume loss somewhat greater than generally seen in this patient's age group. There is no hydrocephalus.  There are no parenchymal masses or mass effect. There is no evidence of a cortical infarct.  There are no extra-axial masses or abnormal fluid collections.  There is no intracranial hemorrhage.  Visualized sinuses and mastoid air cells are clear. No skull lesion.   IMPRESSION: 1. No acute intracranial abnormalities. 2. Volume loss somewhat greater than expected for age. No other abnormality.   Electronically Signed   By: Lajean Manes M.D.   On: 02/14/2015 20:11   Ct Angio Chest Pe W/cm &/or Wo Cm  02/14/2015   CLINICAL DATA:  Abdominal pain radiating into the back.  EXAM: CT ANGIOGRAPHY CHEST  CT ABDOMEN AND PELVIS WITH CONTRAST  TECHNIQUE: Multidetector CT imaging of the chest was performed using the standard protocol during bolus administration of intravenous contrast. Multiplanar CT image reconstructions and MIPs were obtained to evaluate the vascular anatomy. Multidetector CT imaging of the abdomen and pelvis was performed using the standard protocol during bolus administration of intravenous contrast.  CONTRAST:  142m OMNIPAQUE IOHEXOL 350 MG/ML SOLN  COMPARISON:  None.  FINDINGS: CTA CHEST FINDINGS  Technically adequate study with moderately good opacification of the central and proximal segmental pulmonary arteries. No focal filling defects demonstrated. No evidence of significant pulmonary embolus.  Normal heart size. Normal caliber  thoracic aorta. No evidence of aortic dissection. Mild calcification in the aorta. Great vessel origins are patent. Mediastinal lymph nodes are not pathologically enlarged. Esophagus is decompressed. Small esophageal hiatal hernia.  Small right pleural effusion with basilar infiltration and volume loss, possibly pneumonia. Mild dependent changes in the left lung base. No pneumothorax.  CT ABDOMEN and PELVIS FINDINGS  The liver, spleen, gallbladder, pancreas, adrenal glands, abdominal aorta, inferior vena cava, and retroperitoneal lymph nodes are unremarkable. Sub cm low-attenuation lesion in the right kidney probably represents a cyst although too small to characterize. 2 mm stone in the lower pole left kidney. No hydronephrosis or hydroureter. Stomach, small bowel, and colon are not abnormally distended. No free air or free fluid in  the abdomen.  Pelvis: Appendix is not identified. Bladder wall is diffusely thickened possibly suggesting cystitis. Prostate gland is not enlarged. No free or loculated pelvic fluid collections. No pelvic mass or lymphadenopathy. No evidence of diverticulitis. Degenerative changes in the spine. No destructive bone lesions.  Review of the MIP images confirms the above findings.  IMPRESSION: No evidence of significant pulmonary embolus. No evidence of aortic dissection. Consolidation in the right lung base with small pleural effusion, possibly pneumonia.  Nonobstructing stone in the left kidney. Diffuse bladder wall thickening suggesting cystitis. No evidence of bowel obstruction or inflammation.   Electronically Signed   By: Lucienne Capers M.D.   On: 02/14/2015 22:08   Ct Abdomen Pelvis W Contrast  02/14/2015   CLINICAL DATA:  Abdominal pain radiating into the back.  EXAM: CT ANGIOGRAPHY CHEST  CT ABDOMEN AND PELVIS WITH CONTRAST  TECHNIQUE: Multidetector CT imaging of the chest was performed using the standard protocol during bolus administration of intravenous contrast. Multiplanar CT image reconstructions and MIPs were obtained to evaluate the vascular anatomy. Multidetector CT imaging of the abdomen and pelvis was performed using the standard protocol during bolus administration of intravenous contrast.  CONTRAST:  127m OMNIPAQUE IOHEXOL 350 MG/ML SOLN  COMPARISON:  None.  FINDINGS: CTA CHEST FINDINGS  Technically adequate study with moderately good opacification of the central and proximal segmental pulmonary arteries. No focal filling defects demonstrated. No evidence of significant pulmonary embolus.  Normal heart size. Normal caliber thoracic aorta. No evidence of aortic dissection. Mild calcification in the aorta. Great vessel origins are patent. Mediastinal lymph nodes are not pathologically enlarged. Esophagus is decompressed. Small esophageal hiatal hernia.  Small right pleural effusion with basilar  infiltration and volume loss, possibly pneumonia. Mild dependent changes in the left lung base. No pneumothorax.  CT ABDOMEN and PELVIS FINDINGS  The liver, spleen, gallbladder, pancreas, adrenal glands, abdominal aorta, inferior vena cava, and retroperitoneal lymph nodes are unremarkable. Sub cm low-attenuation lesion in the right kidney probably represents a cyst although too small to characterize. 2 mm stone in the lower pole left kidney. No hydronephrosis or hydroureter. Stomach, small bowel, and colon are not abnormally distended. No free air or free fluid in the abdomen.  Pelvis: Appendix is not identified. Bladder wall is diffusely thickened possibly suggesting cystitis. Prostate gland is not enlarged. No free or loculated pelvic fluid collections. No pelvic mass or lymphadenopathy. No evidence of diverticulitis. Degenerative changes in the spine. No destructive bone lesions.  Review of the MIP images confirms the above findings.  IMPRESSION: No evidence of significant pulmonary embolus. No evidence of aortic dissection. Consolidation in the right lung base with small pleural effusion, possibly pneumonia.  Nonobstructing stone in the left kidney. Diffuse bladder wall thickening suggesting cystitis. No  evidence of bowel obstruction or inflammation.   Electronically Signed   By: Lucienne Capers M.D.   On: 02/14/2015 22:08      Assessment: 59 y.o. male with a past medical history significant for MR, schizophrenia, asthma, PE, recently admitted to [facility] due to bilateral pleural effusions requiring thoracentesis, lives at a nursing home, brought in due to worsening dysarthria, intermittent right sided weakness. Patient is a poor historian. On exam has frank dysarthria but no other localizing neurological signs. CT brain without acute abnormality despite being having progressive dysarthria for almost 2 weeks. Unclear if an underlying medical process could be the cause for patient progressive dysarthria, as  he has no other localizing signs on exam. Recommend MRI brain to further investigate his symptoms (could be challenging to complete MRI due to restlessness, will give ativan for sedation). Will follow up after MRI.  Dorian Pod, MD [facility] [phone]  02/14/2015, 10:53 PM

## 2015-02-14 NOTE — ED Notes (Signed)
Pt in with worsening slurred speech, intermittent right sided weakness, generalized fatigue for the past week, also elevated HR, low BP in office- pt was recently off of his xarelto and has history of PE. Pt pale on arrival.

## 2015-02-15 ENCOUNTER — Inpatient Hospital Stay (HOSPITAL_COMMUNITY): Payer: Medicare Other

## 2015-02-15 ENCOUNTER — Encounter (HOSPITAL_COMMUNITY): Payer: Self-pay | Admitting: Internal Medicine

## 2015-02-15 ENCOUNTER — Other Ambulatory Visit: Payer: Self-pay

## 2015-02-15 DIAGNOSIS — J690 Pneumonitis due to inhalation of food and vomit: Principal | ICD-10-CM

## 2015-02-15 DIAGNOSIS — J189 Pneumonia, unspecified organism: Secondary | ICD-10-CM | POA: Insufficient documentation

## 2015-02-15 DIAGNOSIS — J4520 Mild intermittent asthma, uncomplicated: Secondary | ICD-10-CM | POA: Insufficient documentation

## 2015-02-15 DIAGNOSIS — I2699 Other pulmonary embolism without acute cor pulmonale: Secondary | ICD-10-CM

## 2015-02-15 DIAGNOSIS — R109 Unspecified abdominal pain: Secondary | ICD-10-CM

## 2015-02-15 DIAGNOSIS — N179 Acute kidney failure, unspecified: Secondary | ICD-10-CM | POA: Diagnosis present

## 2015-02-15 DIAGNOSIS — R471 Dysarthria and anarthria: Secondary | ICD-10-CM

## 2015-02-15 LAB — BASIC METABOLIC PANEL
Anion gap: 8 (ref 5–15)
BUN: <5 mg/dL — ABNORMAL LOW (ref 6–20)
CALCIUM: 9 mg/dL (ref 8.9–10.3)
CO2: 28 mmol/L (ref 22–32)
CREATININE: 1.09 mg/dL (ref 0.61–1.24)
Chloride: 108 mmol/L (ref 101–111)
Glucose, Bld: 105 mg/dL — ABNORMAL HIGH (ref 65–99)
Potassium: 4.2 mmol/L (ref 3.5–5.1)
SODIUM: 144 mmol/L (ref 135–145)

## 2015-02-15 LAB — LIPID PANEL
CHOL/HDL RATIO: 3.2 ratio
Cholesterol: 132 mg/dL (ref 0–200)
HDL: 41 mg/dL (ref >40)
LDL Cholesterol: 75 mg/dL (ref 0–99)
Triglycerides: 79 mg/dL (ref <150)
VLDL: 16 mg/dL (ref 0–40)

## 2015-02-15 LAB — STREP PNEUMONIAE URINARY ANTIGEN: Strep Pneumo Urinary Antigen: NEGATIVE

## 2015-02-15 LAB — CREATININE, URINE, RANDOM: CREATININE, URINE: 199.68 mg/dL

## 2015-02-15 LAB — INFLUENZA PANEL BY PCR (TYPE A & B)
H1N1 flu by pcr: NOT DETECTED
INFLBPCR: NEGATIVE
Influenza A By PCR: NEGATIVE

## 2015-02-15 LAB — MAGNESIUM: MAGNESIUM: 1.7 mg/dL (ref 1.7–2.4)

## 2015-02-15 LAB — LIPASE, BLOOD: Lipase: 38 U/L (ref 22–51)

## 2015-02-15 LAB — SODIUM, URINE, RANDOM: SODIUM UR: 18 mmol/L

## 2015-02-15 MED ORDER — VANCOMYCIN HCL IN DEXTROSE 1-5 GM/200ML-% IV SOLN
1000.0000 mg | Freq: Two times a day (BID) | INTRAVENOUS | Status: DC
  Administered 2015-02-16 – 2015-02-18 (×6): 1000 mg via INTRAVENOUS
  Filled 2015-02-15 (×7): qty 200

## 2015-02-15 MED ORDER — SODIUM CHLORIDE 0.9 % IV SOLN
INTRAVENOUS | Status: AC
  Administered 2015-02-15: 01:00:00 via INTRAVENOUS

## 2015-02-15 MED ORDER — HALOPERIDOL 1 MG PO TABS
1.0000 mg | ORAL_TABLET | Freq: Three times a day (TID) | ORAL | Status: DC
  Administered 2015-02-15 – 2015-02-18 (×10): 1 mg via ORAL
  Filled 2015-02-15 (×10): qty 1

## 2015-02-15 MED ORDER — KETOROLAC TROMETHAMINE 30 MG/ML IJ SOLN
30.0000 mg | Freq: Once | INTRAMUSCULAR | Status: AC
  Administered 2015-02-15: 30 mg via INTRAVENOUS
  Filled 2015-02-15: qty 1

## 2015-02-15 MED ORDER — SENNOSIDES-DOCUSATE SODIUM 8.6-50 MG PO TABS
1.0000 | ORAL_TABLET | Freq: Every evening | ORAL | Status: DC | PRN
  Filled 2015-02-15: qty 1

## 2015-02-15 MED ORDER — MORPHINE SULFATE (PF) 2 MG/ML IV SOLN
2.0000 mg | INTRAVENOUS | Status: DC | PRN
  Administered 2015-02-15: 2 mg via INTRAVENOUS
  Filled 2015-02-15: qty 1

## 2015-02-15 MED ORDER — CLONAZEPAM 1 MG PO TABS
1.0000 mg | ORAL_TABLET | Freq: Two times a day (BID) | ORAL | Status: DC
  Administered 2015-02-15 – 2015-02-18 (×6): 1 mg via ORAL
  Filled 2015-02-15 (×7): qty 1

## 2015-02-15 MED ORDER — METOCLOPRAMIDE HCL 5 MG/ML IJ SOLN
10.0000 mg | Freq: Once | INTRAMUSCULAR | Status: AC
  Administered 2015-02-15: 10 mg via INTRAVENOUS
  Filled 2015-02-15: qty 2

## 2015-02-15 MED ORDER — LORAZEPAM 2 MG/ML IJ SOLN
1.0000 mg | Freq: Once | INTRAMUSCULAR | Status: AC
  Administered 2015-02-15: 1 mg via INTRAVENOUS
  Filled 2015-02-15: qty 1

## 2015-02-15 MED ORDER — TEMAZEPAM 15 MG PO CAPS
30.0000 mg | ORAL_CAPSULE | Freq: Every day | ORAL | Status: DC
  Administered 2015-02-15 – 2015-02-17 (×3): 30 mg via ORAL
  Filled 2015-02-15 (×3): qty 2

## 2015-02-15 MED ORDER — LORAZEPAM 2 MG/ML IJ SOLN: 2.0000 mg | Freq: Four times a day (QID) | INTRAMUSCULAR | Status: DC | PRN

## 2015-02-15 MED ORDER — DM-GUAIFENESIN ER 30-600 MG PO TB12
1.0000 | ORAL_TABLET | Freq: Two times a day (BID) | ORAL | Status: DC
  Administered 2015-02-15 – 2015-02-18 (×7): 1 via ORAL
  Filled 2015-02-15 (×7): qty 1

## 2015-02-15 MED ORDER — VANCOMYCIN HCL IN DEXTROSE 1-5 GM/200ML-% IV SOLN
1000.0000 mg | Freq: Once | INTRAVENOUS | Status: AC
  Administered 2015-02-15: 1000 mg via INTRAVENOUS
  Filled 2015-02-15: qty 200

## 2015-02-15 MED ORDER — HALOPERIDOL 1 MG PO TABS: 1.0000 mg | ORAL_TABLET | Freq: Three times a day (TID) | ORAL | Status: DC

## 2015-02-15 MED ORDER — ASPIRIN 300 MG RE SUPP: 300.0000 mg | Freq: Every day | RECTAL | Status: DC

## 2015-02-15 MED ORDER — PIPERACILLIN-TAZOBACTAM 3.375 G IVPB
3.3750 g | Freq: Three times a day (TID) | INTRAVENOUS | Status: DC
  Administered 2015-02-15 – 2015-02-18 (×9): 3.375 g via INTRAVENOUS
  Filled 2015-02-15 (×12): qty 50

## 2015-02-15 MED ORDER — BENZTROPINE MESYLATE 0.5 MG PO TABS: 0.5000 mg | ORAL_TABLET | Freq: Two times a day (BID) | ORAL | Status: DC

## 2015-02-15 MED ORDER — DIPHENHYDRAMINE HCL 50 MG/ML IJ SOLN
25.0000 mg | Freq: Once | INTRAMUSCULAR | Status: AC
  Administered 2015-02-15: 25 mg via INTRAVENOUS
  Filled 2015-02-15: qty 1

## 2015-02-15 MED ORDER — QUETIAPINE FUMARATE 50 MG PO TABS
50.0000 mg | ORAL_TABLET | Freq: Three times a day (TID) | ORAL | Status: DC
  Administered 2015-02-15 – 2015-02-18 (×10): 50 mg via ORAL
  Filled 2015-02-15 (×10): qty 1

## 2015-02-15 MED ORDER — ALBUTEROL SULFATE (2.5 MG/3ML) 0.083% IN NEBU: 2.5000 mg | INHALATION_SOLUTION | RESPIRATORY_TRACT | Status: DC | PRN

## 2015-02-15 MED ORDER — GABAPENTIN 300 MG PO CAPS
300.0000 mg | ORAL_CAPSULE | Freq: Three times a day (TID) | ORAL | Status: DC
  Administered 2015-02-15 – 2015-02-18 (×9): 300 mg via ORAL
  Filled 2015-02-15 (×10): qty 1

## 2015-02-15 MED ORDER — RIVAROXABAN 20 MG PO TABS
20.0000 mg | ORAL_TABLET | Freq: Every day | ORAL | Status: DC
  Administered 2015-02-16 – 2015-02-18 (×3): 20 mg via ORAL
  Filled 2015-02-15 (×4): qty 1

## 2015-02-15 MED ORDER — QUETIAPINE FUMARATE 50 MG PO TABS: 50.0000 mg | ORAL_TABLET | Freq: Three times a day (TID) | ORAL | Status: DC

## 2015-02-15 MED ORDER — STROKE: EARLY STAGES OF RECOVERY BOOK
Freq: Once | Status: DC
  Filled 2015-02-15 (×2): qty 1

## 2015-02-15 MED ORDER — ASPIRIN 325 MG PO TABS
325.0000 mg | ORAL_TABLET | Freq: Every day | ORAL | Status: DC
  Administered 2015-02-15: 325 mg via ORAL
  Filled 2015-02-15: qty 1

## 2015-02-15 MED ORDER — VANCOMYCIN HCL IN DEXTROSE 750-5 MG/150ML-% IV SOLN: 750.0000 mg | Freq: Two times a day (BID) | INTRAVENOUS | Status: DC

## 2015-02-15 MED ORDER — BENZTROPINE MESYLATE 0.5 MG PO TABS
0.5000 mg | ORAL_TABLET | Freq: Two times a day (BID) | ORAL | Status: DC
  Administered 2015-02-15 – 2015-02-18 (×6): 0.5 mg via ORAL
  Filled 2015-02-15 (×6): qty 1

## 2015-02-15 MED ORDER — PIPERACILLIN-TAZOBACTAM 3.375 G IVPB 30 MIN
3.3750 g | Freq: Three times a day (TID) | INTRAVENOUS | Status: DC
Start: 2015-02-15 — End: 2015-02-15

## 2015-02-15 MED ORDER — MAGNESIUM SULFATE 2 GM/50ML IV SOLN
2.0000 g | Freq: Once | INTRAVENOUS | Status: DC
  Filled 2015-02-15: qty 50

## 2015-02-15 NOTE — Progress Notes (Addendum)
[facility] PROGRESS NOTE  Jkai Arwood EAV:[id number] DOB: [birth year] DOA: 02/14/2015 PCP: No primary care provider on file.  Subjective: Patient breathing comfortably on room air, SOB improved. Denies cough, weakness, urinary symptoms. States abd pain has resolved. Slightly disoriented, but unsure of baseline for comparison.  Assessment/Plan: Suspected Aspiration Pneumonia: Recently treated for aspiration pneumonia (MRSA positive on pleural fluid culture per discharge summary from [facility]) with, continued with SOB. Apparently was supposed to be on doxycycline which she was not on prior to this admission. Denies any shortness of breath, improved, continue empiric vancomycin and Zosyn for now. Speech therapy evaluation pending   Right arm weakness, slurred speech: Neurology consulted. Weakness and speech improved today-doubt CVA. CT Head neg for acute abnormalities. MRI brain pending. Etiology likely due to persisting respiratory infection.   Abdominal Pain: Improved. CT abd pelvis showing left non-obstructing nephrolithiasis that could explain his LLQ tenderness. Possibly passed stone already. U/A unimpressive for UTI, and patient already on IV abx. Afebrile, no leukocytosis.Abdomen benign on exam today.  Lipase WNL. Continue fluids. Morphine prn for analgesia.  AKI: Likely prerenal d/t dehydration. Continue IVF repletion.   Hx of PE: On xarelto. CTA neg for new PE, SOB resolving.   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 Restart Klonopin  Prolonged QTC: Recheck EKG, check potassium and magnesium. Hold Seroquel and Haldol. Follow.   Addendum: A repeat EKG shows QTC around 459 ms-Will restart Haldol and Seroquel.  DVT Prophylaxis: on xarelto Code Status: Full Family Communication: None at bedside  Disposition Plan: D/c back to group home when  ready  Consultants:  Neurology  Procedures:  None  Antibiotics:  Vancomycin 02/15/2015 >>  Zosyn 02/15/2015 >>  Objective: Filed Vitals:   02/15/15 0800  BP: 126/77  Pulse: 77  Temp:   Resp: 16   No intake or output data in the 24 hours ending 02/15/15 1054 Filed Weights   02/15/15 0000  Weight: 122.925 kg (271 lb)    Exam:   General:  Drowsy, NAD  Head: Atraumatic  Neck: Supple  Cardiovascular: RRR, no m/r/g  Respiratory: Chest wall motion symmetrical, no wheezes  Abdomen: Soft, non distended, +BS  Musculoskeletal: Full ROM in all 4, no edema  Neurologic: Drowsy, disoriented, no focal deficits   Psychiatric: Not psychotic   Data Reviewed: Basic Metabolic Panel:  Recent Labs Lab 02/14/15 1951 02/14/15 2004  NA 141 137  K 3.7 3.5  CL 99* 100*  CO2  --  29  GLUCOSE 115* 116*  BUN 7 6  CREATININE 1.30* 1.33*  CALCIUM  --  9.0   Liver Function Tests:  Recent Labs Lab 02/14/15 2004  AST 16  ALT 10*  ALKPHOS 96  BILITOT 0.9  PROT 6.9  ALBUMIN 2.8*    Recent Labs Lab 02/15/15 0126  LIPASE 38   No results for input(s): AMMONIA in the last 168 hours. CBC:  Recent Labs Lab 02/14/15 1951 02/14/15 2004  WBC  --  7.6  NEUTROABS  --  5.0  HGB 13.6 12.5*  HCT 40.0 38.9*  MCV  --  91.5  PLT  --  267   Cardiac Enzymes: No results for input(s): CKTOTAL, CKMB, CKMBINDEX, TROPONINI in the last 168 hours. BNP (last 3 results) No results for input(s): BNP in the last 8760 hours.  ProBNP (last 3 results) No results for input(s): PROBNP in the last 8760 hours.  CBG: No results for input(s): GLUCAP in the last  168 hours.  No results found for this or any previous visit (from the past 240 hour(s)).   Studies: Ct Head Wo Contrast  02/14/2015   CLINICAL DATA:  59 yr old male with worsening slurred speech, intermittent right sided weakness, generalized fatigue for the past week, also elevated HR, low BP.  EXAM: CT HEAD WITHOUT  CONTRAST  TECHNIQUE: Contiguous axial images were obtained from the base of the skull through the vertex without intravenous contrast.  COMPARISON:  None.  FINDINGS: Ventricles are normal configuration. There is ventricular and sulcal enlargement reflecting volume loss somewhat greater than generally seen in this patient's age group. There is no hydrocephalus.  There are no parenchymal masses or mass effect. There is no evidence of a cortical infarct.  There are no extra-axial masses or abnormal fluid collections.  There is no intracranial hemorrhage.  Visualized sinuses and mastoid air cells are clear. No skull lesion.  IMPRESSION: 1. No acute intracranial abnormalities. 2. Volume loss somewhat greater than expected for age. No other abnormality.   Electronically Signed   By: Amie Portland M.D.   On: 02/14/2015 20:11   Ct Angio Chest Pe W/cm &/or Wo Cm  02/14/2015   CLINICAL DATA:  Abdominal pain radiating into the back.  EXAM: CT ANGIOGRAPHY CHEST  CT ABDOMEN AND PELVIS WITH CONTRAST  TECHNIQUE: Multidetector CT imaging of the chest was performed using the standard protocol during bolus administration of intravenous contrast. Multiplanar CT image reconstructions and MIPs were obtained to evaluate the vascular anatomy. Multidetector CT imaging of the abdomen and pelvis was performed using the standard protocol during bolus administration of intravenous contrast.  CONTRAST:  OMNIPAQUE IOHEXOL 350 MG/ML SOLN  COMPARISON:  None.  FINDINGS: CTA CHEST FINDINGS  Technically adequate study with moderately good opacification of the central and proximal segmental pulmonary arteries. No focal filling defects demonstrated. No evidence of significant pulmonary embolus.  Normal heart size. Normal caliber thoracic aorta. No evidence of aortic dissection. Mild calcification in the aorta. Great vessel origins are patent. Mediastinal lymph nodes are not pathologically enlarged. Esophagus is decompressed. Small esophageal  hiatal hernia.  Small right pleural effusion with basilar infiltration and volume loss, possibly pneumonia. Mild dependent changes in the left lung base. No pneumothorax.  CT ABDOMEN and PELVIS FINDINGS  The liver, spleen, gallbladder, pancreas, adrenal glands, abdominal aorta, inferior vena cava, and retroperitoneal lymph nodes are unremarkable. Sub cm low-attenuation lesion in the right kidney probably represents a cyst although too small to characterize. 2 mm stone in the lower pole left kidney. No hydronephrosis or hydroureter. Stomach, small bowel, and colon are not abnormally distended. No free air or free fluid in the abdomen.  Pelvis: Appendix is not identified. Bladder wall is diffusely thickened possibly suggesting cystitis. Prostate gland is not enlarged. No free or loculated pelvic fluid collections. No pelvic mass or lymphadenopathy. No evidence of diverticulitis. Degenerative changes in the spine. No destructive bone lesions.  Review of the MIP images confirms the above findings.  IMPRESSION: No evidence of significant pulmonary embolus. No evidence of aortic dissection. Consolidation in the right lung base with small pleural effusion, possibly pneumonia.  Nonobstructing stone in the left kidney. Diffuse bladder wall thickening suggesting cystitis. No evidence of bowel obstruction or inflammation.   Electronically Signed   By: Burman Nieves M.D.   On: 02/14/2015 22:08   Ct Abdomen Pelvis W Contrast  02/14/2015   CLINICAL DATA:  Abdominal pain radiating into the back.  EXAM: CT ANGIOGRAPHY CHEST  CT ABDOMEN AND PELVIS WITH CONTRAST  TECHNIQUE: Multidetector CT imaging of the chest was performed using the standard protocol during bolus administration of intravenous contrast. Multiplanar CT image reconstructions and MIPs were obtained to evaluate the vascular anatomy. Multidetector CT imaging of the abdomen and pelvis was performed using the standard protocol during bolus administration of  intravenous contrast.  CONTRAST:  OMNIPAQUE IOHEXOL 350 MG/ML SOLN  COMPARISON:  None.  FINDINGS: CTA CHEST FINDINGS  Technically adequate study with moderately good opacification of the central and proximal segmental pulmonary arteries. No focal filling defects demonstrated. No evidence of significant pulmonary embolus.  Normal heart size. Normal caliber thoracic aorta. No evidence of aortic dissection. Mild calcification in the aorta. Great vessel origins are patent. Mediastinal lymph nodes are not pathologically enlarged. Esophagus is decompressed. Small esophageal hiatal hernia.  Small right pleural effusion with basilar infiltration and volume loss, possibly pneumonia. Mild dependent changes in the left lung base. No pneumothorax.  CT ABDOMEN and PELVIS FINDINGS  The liver, spleen, gallbladder, pancreas, adrenal glands, abdominal aorta, inferior vena cava, and retroperitoneal lymph nodes are unremarkable. Sub cm low-attenuation lesion in the right kidney probably represents a cyst although too small to characterize. 2 mm stone in the lower pole left kidney. No hydronephrosis or hydroureter. Stomach, small bowel, and colon are not abnormally distended. No free air or free fluid in the abdomen.  Pelvis: Appendix is not identified. Bladder wall is diffusely thickened possibly suggesting cystitis. Prostate gland is not enlarged. No free or loculated pelvic fluid collections. No pelvic mass or lymphadenopathy. No evidence of diverticulitis. Degenerative changes in the spine. No destructive bone lesions.  Review of the MIP images confirms the above findings.  IMPRESSION: No evidence of significant pulmonary embolus. No evidence of aortic dissection. Consolidation in the right lung base with small pleural effusion, possibly pneumonia.  Nonobstructing stone in the left kidney. Diffuse bladder wall thickening suggesting cystitis. No evidence of bowel obstruction or inflammation.   Electronically Signed   By:  Burman Nieves M.D.   On: 02/14/2015 22:08   Scheduled Meds: .  stroke: mapping our early stages of recovery book   Does not apply Once  . sodium chloride   Intravenous STAT  . aspirin  300 mg Rectal Daily   Or  . aspirin  325 mg Oral Daily  . dextromethorphan-guaiFENesin  1 tablet Oral BID  . piperacillin-tazobactam (ZOSYN)  IV  3.375 g Intravenous Q8H  . rivaroxaban  20 mg Oral Q supper  . vancomycin  1,000 mg Intravenous Q12H   Continuous Infusions:   Principal Problem:   Aspiration pneumonia Active Problems:   PE (pulmonary embolism)   MR (mental retardation)   Schizophrenia   Asthma   Weakness   Slurred speech   Abdominal pain   AKI (acute kidney injury)  Time spent: 30  Roosvelt Maser, Student-PA   [facility] If 7PM-7AM, please contact night-coverage at [email or web address], password [facility] 02/15/2015, 10:54 AM  LOS: 1 day    Attending MD note  Patient was seen, examined,treatment plan was discussed with the PA-S.  I have personally reviewed the clinical findings, lab, imaging studies and management of this patient in detail. I agree with the documentation, as recorded by thePA-S.   59 year old with recent history of pneumonia-pleural fluid culture positive for MRSA. Readmitted with suspected aspiration pneumonia, and some questionable right-sided weakness that has now resolved. CT head negative. CT injury gram chest negative for pulmonary embolism, shows consolidation on the right side.  Continue vancomycin and Zosyn. Await SLP evaluation. Continue supportive measures. Continue to hold Seroquel and Haldol until QTC is improved. We'll resume benzodiazepine and try and use Ativan for severe agitation. May need psychiatry evaluation at some point.  [facility]

## 2015-02-15 NOTE — Progress Notes (Signed)
Subjective: Patient improved this morning.  Patient reports that he is feeling much better.  Does not feel that hs is having any weakness.  Objective: Current vital signs: BP 110/79 mmHg  Pulse 82  Temp(Src) 97.3 F (36.3 C) (Oral)  Resp 16  Ht 6' (1.829 m)  Wt 122.925 kg (271 lb)  BMI 36.75 kg/m2  SpO2 99% Vital signs in last 24 hours: Temp:  [97.3 F (36.3 C)-98.9 F (37.2 C)] 97.3 F (36.3 C) (09/23 0600) Pulse Rate:  [82-124] 82 (09/23 0600) Resp:  [14-21] 16 (09/23 0600) BP: (102-134)/(50-79) 110/79 mmHg (09/23 0600) SpO2:  [92 %-99 %] 99 % (09/23 0600) Weight:  [122.925 kg (271 lb)] 122.925 kg (271 lb) (09/23 0000)  Intake/Output from previous day:   Intake/Output this shift:   Nutritional status: Diet NPO time specified  Neurologic Exam: Mental Status: Alert, oriented, thought content appropriate. Mild dysarthria without evidence of aphasia. Able to follow 3 step commands without difficulty. Cranial Nerves: II: Discs flat bilaterally; Visual fields grossly normal, pupils equal, round, reactive to light and accommodation III,IV, VI: ptosis not present, extra-ocular motions intact bilaterally V,VII: smile symmetric, facial light touch sensation normal bilaterally VIII: hearing normal bilaterally IX,X: uvula rises symmetrically XI: bilateral shoulder shrug XII: midline tongue extension without atrophy or fasciculations  Motor: Right :Upper extremity 5/5Left: Upper extremity 5/5 Lower extremity 5/5Lower extremity 5/5 Tone and bulk:normal tone throughout; no atrophy noted Deep Tendon Reflexes:  1+ all over  Lab Results: Basic Metabolic Panel:  Recent Labs Lab 02/14/15 1951 02/14/15 2004  NA 141 137  K 3.7 3.5  CL 99* 100*  CO2  --  29  GLUCOSE 115* 116*  BUN 7 6  CREATININE 1.30* 1.33*  CALCIUM  --  9.0    Liver Function  Tests:  Recent Labs Lab 02/14/15 2004  AST 16  ALT 10*  ALKPHOS 96  BILITOT 0.9  PROT 6.9  ALBUMIN 2.8*    Recent Labs Lab 02/15/15 0126  LIPASE 38   No results for input(s): AMMONIA in the last 168 hours.  CBC:  Recent Labs Lab 02/14/15 1951 02/14/15 2004  WBC  --  7.6  NEUTROABS  --  5.0  HGB 13.6 12.5*  HCT 40.0 38.9*  MCV  --  91.5  PLT  --  267    Cardiac Enzymes: No results for input(s): CKTOTAL, CKMB, CKMBINDEX, TROPONINI in the last 168 hours.  Lipid Panel:  Recent Labs Lab 02/15/15 0508  CHOL 132  TRIG 79  HDL 41  CHOLHDL 3.2  VLDL 16  LDLCALC 75    CBG: No results for input(s): GLUCAP in the last 168 hours.  Microbiology: No results found for this or any previous visit.  Coagulation Studies:  Recent Labs  02/14/15 2004  LABPROT 30.1*  INR 2.93*    Imaging: Ct Head Wo Contrast  02/14/2015   CLINICAL DATA:  59 yr old male with worsening slurred speech, intermittent right sided weakness, generalized fatigue for the past week, also elevated HR, low BP.  EXAM: CT HEAD WITHOUT CONTRAST  TECHNIQUE: Contiguous axial images were obtained from the base of the skull through the vertex without intravenous contrast.  COMPARISON:  None.  FINDINGS: Ventricles are normal configuration. There is ventricular and sulcal enlargement reflecting volume loss somewhat greater than generally seen in this patient's age group. There is no hydrocephalus.  There are no parenchymal masses or mass effect. There is no evidence of a cortical infarct.  There are no extra-axial  masses or abnormal fluid collections.  There is no intracranial hemorrhage.  Visualized sinuses and mastoid air cells are clear. No skull lesion.  IMPRESSION: 1. No acute intracranial abnormalities. 2. Volume loss somewhat greater than expected for age. No other abnormality.   Electronically Signed   By: Amie Portland M.D.   On: 02/14/2015 20:11   Ct Angio Chest Pe W/cm &/or Wo Cm  02/14/2015    CLINICAL DATA:  Abdominal pain radiating into the back.  EXAM: CT ANGIOGRAPHY CHEST  CT ABDOMEN AND PELVIS WITH CONTRAST  TECHNIQUE: Multidetector CT imaging of the chest was performed using the standard protocol during bolus administration of intravenous contrast. Multiplanar CT image reconstructions and MIPs were obtained to evaluate the vascular anatomy. Multidetector CT imaging of the abdomen and pelvis was performed using the standard protocol during bolus administration of intravenous contrast.  CONTRAST:  OMNIPAQUE IOHEXOL 350 MG/ML SOLN  COMPARISON:  None.  FINDINGS: CTA CHEST FINDINGS  Technically adequate study with moderately good opacification of the central and proximal segmental pulmonary arteries. No focal filling defects demonstrated. No evidence of significant pulmonary embolus.  Normal heart size. Normal caliber thoracic aorta. No evidence of aortic dissection. Mild calcification in the aorta. Great vessel origins are patent. Mediastinal lymph nodes are not pathologically enlarged. Esophagus is decompressed. Small esophageal hiatal hernia.  Small right pleural effusion with basilar infiltration and volume loss, possibly pneumonia. Mild dependent changes in the left lung base. No pneumothorax.  CT ABDOMEN and PELVIS FINDINGS  The liver, spleen, gallbladder, pancreas, adrenal glands, abdominal aorta, inferior vena cava, and retroperitoneal lymph nodes are unremarkable. Sub cm low-attenuation lesion in the right kidney probably represents a cyst although too small to characterize. 2 mm stone in the lower pole left kidney. No hydronephrosis or hydroureter. Stomach, small bowel, and colon are not abnormally distended. No free air or free fluid in the abdomen.  Pelvis: Appendix is not identified. Bladder wall is diffusely thickened possibly suggesting cystitis. Prostate gland is not enlarged. No free or loculated pelvic fluid collections. No pelvic mass or lymphadenopathy. No evidence of  diverticulitis. Degenerative changes in the spine. No destructive bone lesions.  Review of the MIP images confirms the above findings.  IMPRESSION: No evidence of significant pulmonary embolus. No evidence of aortic dissection. Consolidation in the right lung base with small pleural effusion, possibly pneumonia.  Nonobstructing stone in the left kidney. Diffuse bladder wall thickening suggesting cystitis. No evidence of bowel obstruction or inflammation.   Electronically Signed   By: Burman Nieves M.D.   On: 02/14/2015 22:08   Ct Abdomen Pelvis W Contrast  02/14/2015   CLINICAL DATA:  Abdominal pain radiating into the back.  EXAM: CT ANGIOGRAPHY CHEST  CT ABDOMEN AND PELVIS WITH CONTRAST  TECHNIQUE: Multidetector CT imaging of the chest was performed using the standard protocol during bolus administration of intravenous contrast. Multiplanar CT image reconstructions and MIPs were obtained to evaluate the vascular anatomy. Multidetector CT imaging of the abdomen and pelvis was performed using the standard protocol during bolus administration of intravenous contrast.  CONTRAST:  OMNIPAQUE IOHEXOL 350 MG/ML SOLN  COMPARISON:  None.  FINDINGS: CTA CHEST FINDINGS  Technically adequate study with moderately good opacification of the central and proximal segmental pulmonary arteries. No focal filling defects demonstrated. No evidence of significant pulmonary embolus.  Normal heart size. Normal caliber thoracic aorta. No evidence of aortic dissection. Mild calcification in the aorta. Great vessel origins are patent. Mediastinal lymph nodes are not pathologically  enlarged. Esophagus is decompressed. Small esophageal hiatal hernia.  Small right pleural effusion with basilar infiltration and volume loss, possibly pneumonia. Mild dependent changes in the left lung base. No pneumothorax.  CT ABDOMEN and PELVIS FINDINGS  The liver, spleen, gallbladder, pancreas, adrenal glands, abdominal aorta, inferior vena cava, and  retroperitoneal lymph nodes are unremarkable. Sub cm low-attenuation lesion in the right kidney probably represents a cyst although too small to characterize. 2 mm stone in the lower pole left kidney. No hydronephrosis or hydroureter. Stomach, small bowel, and colon are not abnormally distended. No free air or free fluid in the abdomen.  Pelvis: Appendix is not identified. Bladder wall is diffusely thickened possibly suggesting cystitis. Prostate gland is not enlarged. No free or loculated pelvic fluid collections. No pelvic mass or lymphadenopathy. No evidence of diverticulitis. Degenerative changes in the spine. No destructive bone lesions.  Review of the MIP images confirms the above findings.  IMPRESSION: No evidence of significant pulmonary embolus. No evidence of aortic dissection. Consolidation in the right lung base with small pleural effusion, possibly pneumonia.  Nonobstructing stone in the left kidney. Diffuse bladder wall thickening suggesting cystitis. No evidence of bowel obstruction or inflammation.   Electronically Signed   By: Burman Nieves M.D.   On: 02/14/2015 22:08    Medications:  I have reviewed the patient's current medications. Scheduled: .  stroke: mapping our early stages of recovery book   Does not apply Once  . sodium chloride   Intravenous STAT  . aspirin  300 mg Rectal Daily   Or  . aspirin  325 mg Oral Daily  . dextromethorphan-guaiFENesin  1 tablet Oral BID  . piperacillin-tazobactam (ZOSYN)  IV  3.375 g Intravenous Q8H  . rivaroxaban  20 mg Oral Q supper  . vancomycin  1,000 mg Intravenous Q12H    Assessment/Plan: Speech improved.  No evidence of right sided weakness on examination.  Not as restless.  Patient started on antibiotics.  Presentation may have been metabolic in etiology.    Recommendations: 1.  MRI of the brain pending.     LOS: 1 day   Thana Farr, MD [facility] [phone] 02/15/2015  8:24 AM

## 2015-02-15 NOTE — Progress Notes (Signed)
Pt arrived from ED via tech with VSS and reporting a mild HA. Friends at bedside and helped orient to room. Will continue to monitor. Suzy Bouchard E, [place] 02/15/2015 [phone]

## 2015-02-15 NOTE — ED Notes (Signed)
Per Tiffany at MRI pt unable to tolerate test.  Pt returned to room, floor RN April made aware.

## 2015-02-15 NOTE — Care Management Note (Signed)
Case Management Note  Patient Details  Name: Nethan Caudillo MRN: [id number] Date of Birth: [birth year]  Subjective/Objective:                    Action/Plan: Patient admitted with Aspiration Pneumonia. Patient is from [facility]. CM will continue to follow for discharge needs. Expected Discharge Date:                  Expected Discharge Plan:  Group Home  In-House Referral:     Discharge planning Services     Post Acute Care Choice:    Choice offered to:     DME Arranged:    DME Agency:     HH Arranged:    HH Agency:     Status of Service:  In process, will continue to follow  Medicare Important Message Given:    Date Medicare IM Given:    Medicare IM give by:    Date Additional Medicare IM Given:    Additional Medicare Important Message give by:     If discussed at Long Length of Stay Meetings, dates discussed:    Additional Comments:  Kermit Balo, RN 02/15/2015, 11:08 AM

## 2015-02-15 NOTE — Progress Notes (Signed)
Initial Nutrition Assessment  DOCUMENTATION CODES:   Obesity unspecified  INTERVENTION:  Monitor PO intake for adequacy; if PO intake is poor, provide Ensure Enlive BID   NUTRITION DIAGNOSIS:   Predicted suboptimal nutrient intake related to poor appetite as evidenced by per patient/family report.   GOAL:   Patient will meet greater than or equal to 90% of their needs   MONITOR:   PO intake, Labs, Weight trends, Skin  REASON FOR ASSESSMENT:   Malnutrition Screening Tool    ASSESSMENT:   59 y.o. male with PMH of mental retardation, asthma, schizophrenia, PE, who presents with right arm weakness, slurred speech, shortness of breath, abdominal pain, low blood pressure.  Pt resting at time of visit and refused to speak with RD, stating that he needed his sleep. RD re-attempted to visit pt later and he was asleep again. Unable to perform nutrition-focused physical exam but, at a glance, pt appears well-nourished. Pt was NPO until evaluated by SLP this afternoon; now on a regular diet.   Diet Order:  Diet regular Room service appropriate?: Yes; Fluid consistency:: Thin  Skin:  Reviewed, no issues  Last BM:  9/22  Height:   Ht Readings from Last 1 Encounters:  02/15/15 6' (1.829 m)    Weight:   Wt Readings from Last 1 Encounters:  02/15/15 271 lb (122.925 kg)    Ideal Body Weight:  80.9 kg  BMI:  Body mass index is 36.75 kg/(m^2).  Estimated Nutritional Needs:   Kcal:  2200-2400  Protein:  100-110 grams  Fluid:  >/=2.4 L/day  EDUCATION NEEDS:   No education needs identified at this time  Dorothea Ogle RD, LDN Inpatient Clinical Dietitian Pager: [phone] After Hours Pager: [phone]

## 2015-02-15 NOTE — Progress Notes (Signed)
Attempted report. Suzy Bouchard E, RN 02/15/2015 12:51 AM

## 2015-02-15 NOTE — Evaluation (Signed)
Clinical/Bedside Swallow Evaluation Patient Details  Name: Albert Donovan MRN: [id number] Date of Birth: December 24, 1955  Today's Date: 02/15/2015 Time: SLP Start Time (ACUTE ONLY): 1430 SLP Stop Time (ACUTE ONLY): 1445 SLP Time Calculation (min) (ACUTE ONLY): 15 min  Past Medical History:  Past Medical History  Diagnosis Date  . PE (pulmonary embolism)   . MR (mental retardation)   . Schizophrenia   . Asthma    Past Surgical History: History reviewed. No pertinent past surgical history. HPI:  Albert Donovan is an 59 y.o. male with a past medical history significant for MR, schizophrenia, asthma, PE, recently admitted to [facility] due to bilateral pleural effusions and underwent thoracentesis, lives at a nursing home, and was brought to this ED for evaluation of dysarthria and right sided weakness.   Assessment / Plan / Recommendation Clinical Impression  Pt presents with normal oropharyngeal swallow with adequate mastication despite absence of teeth, brisk swallow response, and no overt s/s of aspiration.  Resume regular diet with thin liquids.  Communication is approaching baseline. No speech-language evaluation required; SLP to sign off.     Aspiration Risk  Mild    Diet Recommendation Age appropriate regular solids;Thin   Medication Administration: Whole meds with liquid      Swallow Study Prior Functional Status       General Date of Onset: 02/15/15 Other Pertinent Information: Albert Donovan is an 59 y.o. male with a past medical history significant for MR, schizophrenia, asthma, PE, recently admitted to [facility] due to bilateral pleural effusions and underwent thoracentesis, lives at a nursing home, and was brought to this ED for evaluation of dysarthria and right sided weakness. Type of Study: Bedside swallow evaluation Diet Prior to this Study: NPO Temperature Spikes Noted: No Respiratory Status: Room air Behavior/Cognition: Alert;Cooperative Oral Cavity - Dentition:  Edentulous Self-Feeding Abilities: Able to feed self Patient Positioning: Upright in bed Baseline Vocal Quality: Normal Volitional Cough: Strong Volitional Swallow: Able to elicit    Oral/Motor/Sensory Function Overall Oral Motor/Sensory Function: Appears within functional limits for tasks assessed   Ice Chips Ice chips: Not tested   Thin Liquid Thin Liquid: Within functional limits Presentation: Cup;Self Fed    Nectar Thick Nectar Thick Liquid: Not tested   Honey Thick Honey Thick Liquid: Not tested   Puree Puree: Within functional limits Presentation: Self Fed;Spoon   Solid  Albert Donovan, [place] CCC/SLP Pager [phone]     Solid: Within functional limits Presentation: Self Fed       Albert Donovan 02/15/2015,2:51 PM

## 2015-02-15 NOTE — Progress Notes (Signed)
Pt IV continues to become occluded at the [facility] site.  Arm board ordered to stabilize the arm/site. Urban Gibson RN [facility]

## 2015-02-15 NOTE — Progress Notes (Addendum)
ANTIBIOTIC/ANTICOAGULATION CONSULT NOTE - INITIAL  Pharmacy Consult for vancomycin and Xarelto Indication: r/o PNA and h/o PE   Patient Measurements: Height: 6' (182.9 cm) Weight: 271 lb (122.925 kg) IBW/kg (Calculated) : 77.6  Vital Signs: Temp: 98 F (36.7 C) (09/22 1842) BP: 102/55 mmHg (09/22 2230) Pulse Rate: 100 (09/22 2230)  Labs:  Recent Labs  02/14/15 1951 02/14/15 2004  WBC  --  7.6  HGB 13.6 12.5*  PLT  --  267  CREATININE 1.30* 1.33*   Estimated Creatinine Clearance: 80.9 mL/min (by C-G formula based on Cr of 1.33).  Medical History: Past Medical History  Diagnosis Date  . PE (pulmonary embolism)   . MR (mental retardation)   . Schizophrenia   . Asthma     Assessment: 59yo male sent to ED by NH for fatigue, poor appetite, and worsening slurred speech, CT shows possible PNA, to begin IV ABX.  Of note pt has been off Xarelto x2wk, resumed today, for surgical procedure at OSH; CT negative for PE.  Goal of Therapy:  Vancomycin trough level 15-20 mcg/ml  Plan:  Rec'd vanc 1g in ED; will give additional  to complete load then start vancomycin  IV Q12H and monitor CBC, Cx, levels prn.  Will continue Xarelto  daily.  Vernard Gambles, PharmD, BCPS  02/15/2015,12:22 AM

## 2015-02-16 DIAGNOSIS — F209 Schizophrenia, unspecified: Secondary | ICD-10-CM

## 2015-02-16 DIAGNOSIS — N179 Acute kidney failure, unspecified: Secondary | ICD-10-CM

## 2015-02-16 LAB — BASIC METABOLIC PANEL
Anion gap: 7 (ref 5–15)
CHLORIDE: 102 mmol/L (ref 101–111)
CO2: 28 mmol/L (ref 22–32)
Calcium: 8.2 mg/dL — ABNORMAL LOW (ref 8.9–10.3)
Creatinine, Ser: 0.98 mg/dL (ref 0.61–1.24)
GFR calc Af Amer: >60 mL/min (ref >60)
GFR calc non Af Amer: >60 mL/min (ref >60)
GLUCOSE: 104 mg/dL — AB (ref 65–99)
POTASSIUM: 3.7 mmol/L (ref 3.5–5.1)
Sodium: 137 mmol/L (ref 135–145)

## 2015-02-16 LAB — MAGNESIUM: MAGNESIUM: 1.6 mg/dL — AB (ref 1.7–2.4)

## 2015-02-16 LAB — CBC
HEMATOCRIT: 35.4 % — AB (ref 39.0–52.0)
HEMOGLOBIN: 11.5 g/dL — AB (ref 13.0–17.0)
MCH: 30 pg (ref 26.0–34.0)
MCHC: 32.5 g/dL (ref 30.0–36.0)
MCV: 92.4 fL (ref 78.0–100.0)
Platelets: 187 10*3/uL (ref 150–400)
RBC: 3.83 MIL/uL — AB (ref 4.22–5.81)
RDW: 14.2 % (ref 11.5–15.5)
WBC: 5.8 10*3/uL (ref 4.0–10.5)

## 2015-02-16 LAB — URINE CULTURE

## 2015-02-16 LAB — LEGIONELLA PNEUMOPHILA SEROGP 1 UR AG: L. pneumophila Serogp 1 Ur Ag: NEGATIVE

## 2015-02-16 LAB — HEMOGLOBIN A1C
HEMOGLOBIN A1C: 5.7 % — AB (ref 4.8–5.6)
Mean Plasma Glucose: 117 mg/dL

## 2015-02-16 MED ORDER — ONDANSETRON HCL 4 MG/2ML IJ SOLN: 4.0000 mg | Freq: Four times a day (QID) | INTRAMUSCULAR | Status: DC | PRN

## 2015-02-16 MED ORDER — PANTOPRAZOLE SODIUM 40 MG PO TBEC
40.0000 mg | DELAYED_RELEASE_TABLET | Freq: Two times a day (BID) | ORAL | Status: DC
  Administered 2015-02-16 – 2015-02-18 (×5): 40 mg via ORAL
  Filled 2015-02-16 (×5): qty 1

## 2015-02-16 MED ORDER — POTASSIUM CHLORIDE CRYS ER 20 MEQ PO TBCR
40.0000 meq | EXTENDED_RELEASE_TABLET | Freq: Once | ORAL | Status: AC
  Administered 2015-02-16: 40 meq via ORAL
  Filled 2015-02-16: qty 2

## 2015-02-16 MED ORDER — LORAZEPAM 2 MG/ML IJ SOLN: 1.0000 mg | Freq: Four times a day (QID) | INTRAMUSCULAR | Status: DC | PRN

## 2015-02-16 NOTE — Progress Notes (Addendum)
PATIENT DETAILS Name: Albert Donovan Age: 59 y.o. Sex: male Date of Birth: September 03, 1955 Admit Date: 02/14/2015 Admitting Physician Lorretta Harp, MD PCP:No primary care provider on file.  Subjective: Lying comfortably in bed. Does not want to eat breakfast. Follows commands. Moves all 4 extremities.  Assessment/Plan: Principal Problem: Healthcare associated pneumonia: With suspected aspiration component. Recent hospitalization in [facility]-for similar issue, had thoracocentesis-pleural fluid culture positive for MRSA. Early doing well, afebrile, sleeping comfortably this morning. Will continue both vancomycin and Zosyn for now, and plan on narrowing to just doxycycline prior to discharge. Blood cultures continue to be negative so far.  Active Problems: Right arm weakness/slurred speech: Resolved. Suspect this was secondary to metabolic abnormalities and pneumonia. MRI brain negative for CVA. Suspect no further workup required at this time.  Abdominal pain: Resolved. CT abdomen/pelvis on admission and negative for acute abnormalities. Abdomen is soft, nontender on exam. Etiology of abdominal pain remains unknown-CT abdomen did show a nonobstructing ureteral stone-? Passed stone  ARF: Secondary to prerenal azotemia, resolved with hydration. Stop IV fluids.  History of pulmonary embolism: Continue xarelto. CTA neg for new PE  Mental Retardation complicated by Schizophrenia: Haldol and Seroquel initially on hold due to prolonged QT. QT now within normal limits. Continue Seroquel, Haldol, Klonopin. Currently stable-quiet and calm.  Disposition: Remain inpatient-back to group home in the next 1-2 days.  Antimicrobial agents  See below  Anti-infectives    Start     Dose/Rate Route Frequency Ordered Stop   02/15/15 1200  vancomycin (VANCOCIN) IVPB 1000 mg/200 mL premix     1,000 mg 200 mL/hr over 60 Minutes Intravenous Every 12 hours 02/15/15 0028     02/15/15  0600  piperacillin-tazobactam (ZOSYN) IVPB 3.375 g     3.375 g 12.5 mL/hr over 240 Minutes Intravenous Every 8 hours 02/15/15 0017     02/15/15 0030  vancomycin (VANCOCIN) IVPB 1000 mg/200 mL premix     1,000 mg 200 mL/hr over 60 Minutes Intravenous  Once 02/15/15 0028 02/15/15 0301   02/15/15 0015  piperacillin-tazobactam (ZOSYN) IVPB 3.375 g  Status:  Discontinued     3.375 g 100 mL/hr over 30 Minutes Intravenous 3 times per day 02/15/15 0014 02/15/15 0017   02/15/15 0015  vancomycin (VANCOCIN) IVPB 750 mg/150 ml premix  Status:  Discontinued     750 mg 150 mL/hr over 60 Minutes Intravenous Every 12 hours 02/15/15 0014 02/15/15 0017   02/14/15 2230  vancomycin (VANCOCIN) IVPB 1000 mg/200 mL premix     1,000 mg 200 mL/hr over 60 Minutes Intravenous  Once 02/14/15 2221 02/15/15 0034   02/14/15 2230  piperacillin-tazobactam (ZOSYN) IVPB 3.375 g     3.375 g 100 mL/hr over 30 Minutes Intravenous  Once 02/14/15 2221 02/14/15 2328      DVT Prophylaxis: Xarelto  Code Status: Full code   Family Communication None at bedsdie  Procedures: None  CONSULTS:  None  Time spent 30 minutes-Greater than 50% of this time was spent in counseling, explanation of diagnosis, planning of further management, and coordination of care.  MEDICATIONS: Scheduled Meds: .  stroke: mapping our early stages of recovery book   Does not apply Once  . aspirin  300 mg Rectal Daily   Or  . aspirin  325 mg Oral Daily  . benztropine  0.5 mg Oral BID  . clonazePAM  1 mg Oral BID  . dextromethorphan-guaiFENesin  1  tablet Oral BID  . gabapentin  300 mg Oral TID  . haloperidol  1 mg Oral TID  . magnesium sulfate 1 - 4 g bolus IVPB  2 g Intravenous Once  . pantoprazole  40 mg Oral BID  . piperacillin-tazobactam (ZOSYN)  IV  3.375 g Intravenous Q8H  . QUEtiapine  50 mg Oral TID  . rivaroxaban  20 mg Oral Q supper  . temazepam  30 mg Oral QHS  . vancomycin  1,000 mg Intravenous Q12H   Continuous  Infusions:  PRN Meds:.albuterol, LORazepam, morphine injection, ondansetron, senna-docusate    PHYSICAL EXAM: Vital signs in last 24 hours: Filed Vitals:   02/15/15 1200 02/15/15 1719 02/15/15 2245 02/16/15 0723  BP: 165/81 116/73 108/62 113/61  Pulse:  72 92 78  Temp:  98.3 F (36.8 C) 98.1 F (36.7 C) 98.6 F (37 C)  TempSrc:  Oral Axillary Tympanic  Resp: Height:      Weight:      SpO2: 99% 97% 98% 97%    Weight change:  Filed Weights   02/15/15 0000  Weight: 122.925 kg (271 lb)   Body mass index is 36.75 kg/(m^2).   Gen Exam: Awake and alert-not in any distress having lying comfortably in bed. Following all commands Neck: Supple, No JVD.   Chest: B/L Clear.   CVS: S1 S2 Regular, no murmurs.  Abdomen: soft, BS +, non tender, non distended.  Extremities: no edema, lower extremities warm to touch. Neurologic: Moves all 4 limbs on command Skin: No Rash.   Wounds: N/A.    Intake/Output from previous day: No intake or output data in the 24 hours ending 02/16/15 0902   LAB RESULTS: CBC  Recent Labs Lab 02/14/15 1951 02/14/15 2004 02/16/15 0556  WBC  --  7.6 5.8  HGB 13.6 12.5* 11.5*  HCT 40.0 38.9* 35.4*  PLT  --  267 187  MCV  --  91.5 92.4  MCH  --  29.4 30.0  MCHC  --  32.1 32.5  RDW  --  14.2 14.2  LYMPHSABS  --  1.6  --   MONOABS  --  1.0  --   EOSABS  --  0.1  --   BASOSABS  --  0.0  --     Chemistries   Recent Labs Lab 02/14/15 1951 02/14/15 2004 02/15/15 1419 02/16/15 0556  NA 141 137 144 137  K 3.7 3.5 4.2 3.7  CL 99* 100* 108 102  CO2  --  GLUCOSE 115* 116* 105* 104*  BUN 7 6 <5* <5*  CREATININE 1.30* 1.33* 1.09 0.98  CALCIUM  --  9.0 9.0 8.2*  MG  --   --  1.7 1.6*    CBG: No results for input(s): GLUCAP in the last 168 hours.  GFR Estimated Creatinine Clearance: 109.9 mL/min (by C-G formula based on Cr of 0.98).  Coagulation profile  Recent Labs Lab 02/14/15 2004  INR 2.93*    Cardiac  Enzymes No results for input(s): CKMB, TROPONINI, MYOGLOBIN in the last 168 hours.  Invalid input(s): CK  Invalid input(s): POCBNP No results for input(s): DDIMER in the last 72 hours.  Recent Labs  02/15/15 0508  HGBA1C 5.7*    Recent Labs  02/15/15 0508  CHOL 132  HDL 41  LDLCALC 75  TRIG 79  CHOLHDL 3.2   No results for input(s): TSH, T4TOTAL, T3FREE, THYROIDAB in the last 72 hours.  Invalid input(s):  FREET3 No results for input(s): VITAMINB12, FOLATE, FERRITIN, TIBC, IRON, RETICCTPCT in the last 72 hours.  Recent Labs  02/15/15 0126  LIPASE 38    Urine Studies No results for input(s): UHGB, CRYS in the last 72 hours.  Invalid input(s): UACOL, UAPR, USPG, UPH, UTP, UGL, UKET, UBIL, UNIT, UROB, ULEU, UEPI, UWBC, URBC, UBAC, CAST, UCOM, BILUA  MICROBIOLOGY: Recent Results (from the past 240 hour(s))  Blood culture (routine x 2)     Status: None (Preliminary result)   Collection Time: 02/14/15  7:41 PM  Result Value Ref Range Status   Specimen Description BLOOD RIGHT ARM  Final   Special Requests BOTTLES DRAWN AEROBIC AND ANAEROBIC  Final   Culture NO GROWTH < 24 HOURS  Final   Report Status PENDING  Incomplete  Blood culture (routine x 2)     Status: None (Preliminary result)   Collection Time: 02/14/15  7:51 PM  Result Value Ref Range Status   Specimen Description BLOOD RIGHT FOREARM  Final   Special Requests BOTTLES DRAWN AEROBIC AND ANAEROBIC  Final   Culture NO GROWTH < 24 HOURS  Final   Report Status PENDING  Incomplete    RADIOLOGY STUDIES/RESULTS: Ct Head Wo Contrast  02/14/2015   CLINICAL DATA:  59 yr old male with worsening slurred speech, intermittent right sided weakness, generalized fatigue for the past week, also elevated HR, low BP.  EXAM: CT HEAD WITHOUT CONTRAST  TECHNIQUE: Contiguous axial images were obtained from the base of the skull through the vertex without intravenous contrast.  COMPARISON:  None.  FINDINGS: Ventricles are  normal configuration. There is ventricular and sulcal enlargement reflecting volume loss somewhat greater than generally seen in this patient's age group. There is no hydrocephalus.  There are no parenchymal masses or mass effect. There is no evidence of a cortical infarct.  There are no extra-axial masses or abnormal fluid collections.  There is no intracranial hemorrhage.  Visualized sinuses and mastoid air cells are clear. No skull lesion.  IMPRESSION: 1. No acute intracranial abnormalities. 2. Volume loss somewhat greater than expected for age. No other abnormality.   Electronically Signed   By: Amie Portland M.D.   On: 02/14/2015 20:11   Ct Angio Chest Pe W/cm &/or Wo Cm  02/14/2015   CLINICAL DATA:  Abdominal pain radiating into the back.  EXAM: CT ANGIOGRAPHY CHEST  CT ABDOMEN AND PELVIS WITH CONTRAST  TECHNIQUE: Multidetector CT imaging of the chest was performed using the standard protocol during bolus administration of intravenous contrast. Multiplanar CT image reconstructions and MIPs were obtained to evaluate the vascular anatomy. Multidetector CT imaging of the abdomen and pelvis was performed using the standard protocol during bolus administration of intravenous contrast.  CONTRAST:  OMNIPAQUE IOHEXOL 350 MG/ML SOLN  COMPARISON:  None.  FINDINGS: CTA CHEST FINDINGS  Technically adequate study with moderately good opacification of the central and proximal segmental pulmonary arteries. No focal filling defects demonstrated. No evidence of significant pulmonary embolus.  Normal heart size. Normal caliber thoracic aorta. No evidence of aortic dissection. Mild calcification in the aorta. Great vessel origins are patent. Mediastinal lymph nodes are not pathologically enlarged. Esophagus is decompressed. Small esophageal hiatal hernia.  Small right pleural effusion with basilar infiltration and volume loss, possibly pneumonia. Mild dependent changes in the left lung base. No pneumothorax.  CT ABDOMEN  and PELVIS FINDINGS  The liver, spleen, gallbladder, pancreas, adrenal glands, abdominal aorta, inferior vena cava, and retroperitoneal lymph nodes are unremarkable.  Sub cm low-attenuation lesion in the right kidney probably represents a cyst although too small to characterize. 2 mm stone in the lower pole left kidney. No hydronephrosis or hydroureter. Stomach, small bowel, and colon are not abnormally distended. No free air or free fluid in the abdomen.  Pelvis: Appendix is not identified. Bladder wall is diffusely thickened possibly suggesting cystitis. Prostate gland is not enlarged. No free or loculated pelvic fluid collections. No pelvic mass or lymphadenopathy. No evidence of diverticulitis. Degenerative changes in the spine. No destructive bone lesions.  Review of the MIP images confirms the above findings.  IMPRESSION: No evidence of significant pulmonary embolus. No evidence of aortic dissection. Consolidation in the right lung base with small pleural effusion, possibly pneumonia.  Nonobstructing stone in the left kidney. Diffuse bladder wall thickening suggesting cystitis. No evidence of bowel obstruction or inflammation.   Electronically Signed   By: Burman Nieves M.D.   On: 02/14/2015 22:08   Mr Brain Wo Contrast  02/15/2015   CLINICAL DATA:  59 year old mentally handicapped male presents with right upper extremity weakness, slurred speech, shortness of breath, hypotension. Initial encounter.  EXAM: MRI HEAD WITHOUT CONTRAST  TECHNIQUE: Multiplanar, multiecho pulse sequences of the brain and surrounding structures were obtained without intravenous contrast.  COMPARISON:  Head CT without contrast 02/14/2015  FINDINGS: Intermittently motion degraded and discontinued prior to completion due to patient agitation. Coronal T2 weighted imaging was not obtained.  No convincing restricted diffusion to suggest acute infarction. No midline shift, mass effect, evidence of mass lesion, ventriculomegaly,  extra-axial collection or acute intracranial hemorrhage. Cervicomedullary junction and pituitary are within normal limits. Negative visualized cervical spine. Major intracranial vascular flow voids are grossly preserved. Allowing for motion, gray and white matter signal appears to be within normal limits for age throughout the brain.  Mild right mastoid effusion. Trace paranasal sinus mucosal thickening. Normal bone marrow signal. Negative visualized orbit and scalp soft tissues.  IMPRESSION: No acute intracranial abnormality identified on this intermittently motion degraded study.   Electronically Signed   By: Odessa Fleming M.D.   On: 02/15/2015 20:30   Ct Abdomen Pelvis W Contrast  02/14/2015   CLINICAL DATA:  Abdominal pain radiating into the back.  EXAM: CT ANGIOGRAPHY CHEST  CT ABDOMEN AND PELVIS WITH CONTRAST  TECHNIQUE: Multidetector CT imaging of the chest was performed using the standard protocol during bolus administration of intravenous contrast. Multiplanar CT image reconstructions and MIPs were obtained to evaluate the vascular anatomy. Multidetector CT imaging of the abdomen and pelvis was performed using the standard protocol during bolus administration of intravenous contrast.  CONTRAST:  OMNIPAQUE IOHEXOL 350 MG/ML SOLN  COMPARISON:  None.  FINDINGS: CTA CHEST FINDINGS  Technically adequate study with moderately good opacification of the central and proximal segmental pulmonary arteries. No focal filling defects demonstrated. No evidence of significant pulmonary embolus.  Normal heart size. Normal caliber thoracic aorta. No evidence of aortic dissection. Mild calcification in the aorta. Great vessel origins are patent. Mediastinal lymph nodes are not pathologically enlarged. Esophagus is decompressed. Small esophageal hiatal hernia.  Small right pleural effusion with basilar infiltration and volume loss, possibly pneumonia. Mild dependent changes in the left lung base. No pneumothorax.  CT  ABDOMEN and PELVIS FINDINGS  The liver, spleen, gallbladder, pancreas, adrenal glands, abdominal aorta, inferior vena cava, and retroperitoneal lymph nodes are unremarkable. Sub cm low-attenuation lesion in the right kidney probably represents a cyst although too small to characterize. 2 mm stone in the lower  pole left kidney. No hydronephrosis or hydroureter. Stomach, small bowel, and colon are not abnormally distended. No free air or free fluid in the abdomen.  Pelvis: Appendix is not identified. Bladder wall is diffusely thickened possibly suggesting cystitis. Prostate gland is not enlarged. No free or loculated pelvic fluid collections. No pelvic mass or lymphadenopathy. No evidence of diverticulitis. Degenerative changes in the spine. No destructive bone lesions.  Review of the MIP images confirms the above findings.  IMPRESSION: No evidence of significant pulmonary embolus. No evidence of aortic dissection. Consolidation in the right lung base with small pleural effusion, possibly pneumonia.  Nonobstructing stone in the left kidney. Diffuse bladder wall thickening suggesting cystitis. No evidence of bowel obstruction or inflammation.   Electronically Signed   By: Burman Nieves M.D.   On: 02/14/2015 22:08    Jeoffrey Massed, MD  [facility] Pager:[phone]  If 7PM-7AM, please contact night-coverage [email or web address] Password [email or web address] 02/16/2015, 9:02 AM   LOS: 2 days

## 2015-02-16 NOTE — Progress Notes (Signed)
Subjective: Patient sleeping when I enter the room but can be awakened by calling his name.    Objective: Current vital signs: BP 123/71 mmHg  Pulse 77  Temp(Src) 98.1 F (36.7 C) (Oral)  Resp 17  Ht 6' (1.829 m)  Wt 122.925 kg (271 lb)  BMI 36.75 kg/m2  SpO2 96% Vital signs in last 24 hours: Temp:  [98.1 F (36.7 C)-98.6 F (37 C)] 98.1 F (36.7 C) (09/24 1102) Pulse Rate:  [72-92] 77 (09/24 1102) Resp:  [16-18] 17 (09/24 1102) BP: (108-123)/(61-73) 123/71 mmHg (09/24 1102) SpO2:  [96 %-98 %] 96 % (09/24 1102)  Intake/Output from previous day:   Intake/Output this shift:   Nutritional status: Diet regular Room service appropriate?: Yes; Fluid consistency:: Thin  Neurologic Exam: Mental Status: Lethargic but arousable.  Initially speech is slurred but with continued talking speech clears up.   Cranial Nerves: II: Discs flat bilaterally; Visual fields grossly normal, pupils equal, round, reactive to light and accommodation III,IV, VI: ptosis not present, extra-ocular motions intact bilaterally V,VII: smile symmetric, facial light touch sensation normal bilaterally VIII: hearing normal bilaterally IX,X: uvula rises symmetrically XI: bilateral shoulder shrug XII: midline tongue extension without atrophy or fasciculations Motor: Moves all extremities against gravity with no focal weakness noted.    Lab Results: Basic Metabolic Panel:  Recent Labs Lab 02/14/15 1951 02/14/15 2004 02/15/15 1419 02/16/15 0556  NA 141 137 144 137  K 3.7 3.5 4.2 3.7  CL 99* 100* 108 102  CO2  --  29 28 28   GLUCOSE 115* 116* 105* 104*  BUN 7 6 <5* <5*  CREATININE 1.30* 1.33* 1.09 0.98  CALCIUM  --  9.0 9.0 8.2*  MG  --   --  1.7 1.6*    Liver Function Tests:  Recent Labs Lab 02/14/15 2004  AST 16  ALT 10*  ALKPHOS 96  BILITOT 0.9  PROT 6.9  ALBUMIN 2.8*    Recent Labs Lab 02/15/15 0126  LIPASE 38   No results for input(s): AMMONIA in the last 168  hours.  CBC:  Recent Labs Lab 02/14/15 1951 02/14/15 2004 02/16/15 0556  WBC  --  7.6 5.8  NEUTROABS  --  5.0  --   HGB 13.6 12.5* 11.5*  HCT 40.0 38.9* 35.4*  MCV  --  91.5 92.4  PLT  --  267 187    Cardiac Enzymes: No results for input(s): CKTOTAL, CKMB, CKMBINDEX, TROPONINI in the last 168 hours.  Lipid Panel:  Recent Labs Lab 02/15/15 0508  CHOL 132  TRIG 79  HDL 41  CHOLHDL 3.2  VLDL 16  LDLCALC 75    CBG: No results for input(s): GLUCAP in the last 168 hours.  Microbiology: Results for orders placed or performed during the hospital encounter of 02/14/15  Blood culture (routine x 2)     Status: None (Preliminary result)   Collection Time: 02/14/15  7:41 PM  Result Value Ref Range Status   Specimen Description BLOOD RIGHT ARM  Final   Special Requests BOTTLES DRAWN AEROBIC AND ANAEROBIC  Final   Culture NO GROWTH 2 DAYS  Final   Report Status PENDING  Incomplete  Blood culture (routine x 2)     Status: None (Preliminary result)   Collection Time: 02/14/15  7:51 PM  Result Value Ref Range Status   Specimen Description BLOOD RIGHT FOREARM  Final   Special Requests BOTTLES DRAWN AEROBIC AND ANAEROBIC  Final   Culture NO GROWTH 2 DAYS  Final   Report Status PENDING  Incomplete  Urine culture     Status: None   Collection Time: 02/14/15 10:50 PM  Result Value Ref Range Status   Specimen Description URINE, CLEAN CATCH  Final   Special Requests NONE  Final   Culture MULTIPLE SPECIES PRESENT, SUGGEST RECOLLECTION  Final   Report Status 02/16/2015 FINAL  Final    Coagulation Studies:  Recent Labs  02/14/15 2004  LABPROT 30.1*  INR 2.93*    Imaging: Ct Head Wo Contrast  02/14/2015   CLINICAL DATA:  59 yr old male with worsening slurred speech, intermittent right sided weakness, generalized fatigue for the past week, also elevated HR, low BP.  EXAM: CT HEAD WITHOUT CONTRAST  TECHNIQUE: Contiguous axial images were obtained from the base of  the skull through the vertex without intravenous contrast.  COMPARISON:  None.  FINDINGS: Ventricles are normal configuration. There is ventricular and sulcal enlargement reflecting volume loss somewhat greater than generally seen in this patient's age group. There is no hydrocephalus.  There are no parenchymal masses or mass effect. There is no evidence of a cortical infarct.  There are no extra-axial masses or abnormal fluid collections.  There is no intracranial hemorrhage.  Visualized sinuses and mastoid air cells are clear. No skull lesion.  IMPRESSION: 1. No acute intracranial abnormalities. 2. Volume loss somewhat greater than expected for age. No other abnormality.   Electronically Signed   By: Amie Portland M.D.   On: 02/14/2015 20:11   Ct Angio Chest Pe W/cm &/or Wo Cm  02/14/2015   CLINICAL DATA:  Abdominal pain radiating into the back.  EXAM: CT ANGIOGRAPHY CHEST  CT ABDOMEN AND PELVIS WITH CONTRAST  TECHNIQUE: Multidetector CT imaging of the chest was performed using the standard protocol during bolus administration of intravenous contrast. Multiplanar CT image reconstructions and MIPs were obtained to evaluate the vascular anatomy. Multidetector CT imaging of the abdomen and pelvis was performed using the standard protocol during bolus administration of intravenous contrast.  CONTRAST:  OMNIPAQUE IOHEXOL 350 MG/ML SOLN  COMPARISON:  None.  FINDINGS: CTA CHEST FINDINGS  Technically adequate study with moderately good opacification of the central and proximal segmental pulmonary arteries. No focal filling defects demonstrated. No evidence of significant pulmonary embolus.  Normal heart size. Normal caliber thoracic aorta. No evidence of aortic dissection. Mild calcification in the aorta. Great vessel origins are patent. Mediastinal lymph nodes are not pathologically enlarged. Esophagus is decompressed. Small esophageal hiatal hernia.  Small right pleural effusion with basilar infiltration and  volume loss, possibly pneumonia. Mild dependent changes in the left lung base. No pneumothorax.  CT ABDOMEN and PELVIS FINDINGS  The liver, spleen, gallbladder, pancreas, adrenal glands, abdominal aorta, inferior vena cava, and retroperitoneal lymph nodes are unremarkable. Sub cm low-attenuation lesion in the right kidney probably represents a cyst although too small to characterize. 2 mm stone in the lower pole left kidney. No hydronephrosis or hydroureter. Stomach, small bowel, and colon are not abnormally distended. No free air or free fluid in the abdomen.  Pelvis: Appendix is not identified. Bladder wall is diffusely thickened possibly suggesting cystitis. Prostate gland is not enlarged. No free or loculated pelvic fluid collections. No pelvic mass or lymphadenopathy. No evidence of diverticulitis. Degenerative changes in the spine. No destructive bone lesions.  Review of the MIP images confirms the above findings.  IMPRESSION: No evidence of significant pulmonary embolus. No evidence of aortic dissection. Consolidation in the right lung base with small  pleural effusion, possibly pneumonia.  Nonobstructing stone in the left kidney. Diffuse bladder wall thickening suggesting cystitis. No evidence of bowel obstruction or inflammation.   Electronically Signed   By: Burman Nieves M.D.   On: 02/14/2015 22:08   Mr Brain Wo Contrast  02/15/2015   CLINICAL DATA:  59 year old mentally handicapped male presents with right upper extremity weakness, slurred speech, shortness of breath, hypotension. Initial encounter.  EXAM: MRI HEAD WITHOUT CONTRAST  TECHNIQUE: Multiplanar, multiecho pulse sequences of the brain and surrounding structures were obtained without intravenous contrast.  COMPARISON:  Head CT without contrast 02/14/2015  FINDINGS: Intermittently motion degraded and discontinued prior to completion due to patient agitation. Coronal T2 weighted imaging was not obtained.  No convincing restricted diffusion to  suggest acute infarction. No midline shift, mass effect, evidence of mass lesion, ventriculomegaly, extra-axial collection or acute intracranial hemorrhage. Cervicomedullary junction and pituitary are within normal limits. Negative visualized cervical spine. Major intracranial vascular flow voids are grossly preserved. Allowing for motion, gray and white matter signal appears to be within normal limits for age throughout the brain.  Mild right mastoid effusion. Trace paranasal sinus mucosal thickening. Normal bone marrow signal. Negative visualized orbit and scalp soft tissues.  IMPRESSION: No acute intracranial abnormality identified on this intermittently motion degraded study.   Electronically Signed   By: Odessa Fleming M.D.   On: 02/15/2015 20:30   Ct Abdomen Pelvis W Contrast  02/14/2015   CLINICAL DATA:  Abdominal pain radiating into the back.  EXAM: CT ANGIOGRAPHY CHEST  CT ABDOMEN AND PELVIS WITH CONTRAST  TECHNIQUE: Multidetector CT imaging of the chest was performed using the standard protocol during bolus administration of intravenous contrast. Multiplanar CT image reconstructions and MIPs were obtained to evaluate the vascular anatomy. Multidetector CT imaging of the abdomen and pelvis was performed using the standard protocol during bolus administration of intravenous contrast.  CONTRAST:  OMNIPAQUE IOHEXOL 350 MG/ML SOLN  COMPARISON:  None.  FINDINGS: CTA CHEST FINDINGS  Technically adequate study with moderately good opacification of the central and proximal segmental pulmonary arteries. No focal filling defects demonstrated. No evidence of significant pulmonary embolus.  Normal heart size. Normal caliber thoracic aorta. No evidence of aortic dissection. Mild calcification in the aorta. Great vessel origins are patent. Mediastinal lymph nodes are not pathologically enlarged. Esophagus is decompressed. Small esophageal hiatal hernia.  Small right pleural effusion with basilar infiltration and volume  loss, possibly pneumonia. Mild dependent changes in the left lung base. No pneumothorax.  CT ABDOMEN and PELVIS FINDINGS  The liver, spleen, gallbladder, pancreas, adrenal glands, abdominal aorta, inferior vena cava, and retroperitoneal lymph nodes are unremarkable. Sub cm low-attenuation lesion in the right kidney probably represents a cyst although too small to characterize. 2 mm stone in the lower pole left kidney. No hydronephrosis or hydroureter. Stomach, small bowel, and colon are not abnormally distended. No free air or free fluid in the abdomen.  Pelvis: Appendix is not identified. Bladder wall is diffusely thickened possibly suggesting cystitis. Prostate gland is not enlarged. No free or loculated pelvic fluid collections. No pelvic mass or lymphadenopathy. No evidence of diverticulitis. Degenerative changes in the spine. No destructive bone lesions.  Review of the MIP images confirms the above findings.  IMPRESSION: No evidence of significant pulmonary embolus. No evidence of aortic dissection. Consolidation in the right lung base with small pleural effusion, possibly pneumonia.  Nonobstructing stone in the left kidney. Diffuse bladder wall thickening suggesting cystitis. No evidence of bowel obstruction or  inflammation.   Electronically Signed   By: Burman Nieves M.D.   On: 02/14/2015 22:08    Medications:  I have reviewed the patient's current medications. Scheduled: .  stroke: mapping our early stages of recovery book   Does not apply Once  . benztropine  0.5 mg Oral BID  . clonazePAM  1 mg Oral BID  . dextromethorphan-guaiFENesin  1 tablet Oral BID  . gabapentin  300 mg Oral TID  . haloperidol  1 mg Oral TID  . magnesium sulfate 1 - 4 g bolus IVPB  2 g Intravenous Once  . pantoprazole  40 mg Oral BID  . piperacillin-tazobactam (ZOSYN)  IV  3.375 g Intravenous Q8H  . QUEtiapine  50 mg Oral TID  . rivaroxaban  20 mg Oral Q supper  . temazepam  30 mg Oral QHS  . vancomycin  1,000 mg  Intravenous Q12H    Assessment/Plan: Patient improved.  MRI of the brain personally reviewed and shows no acute changes.  Patient on Rivaroxaban.  Recommendations: No further neurologic intervention is recommended at this time.  If further questions arise, please call or page at that time.  Thank you for allowing neurology to participate in the care of this patient.  Continue Rivaroxaban.     Thana Farr, MD [facility] [phone] 02/16/2015  2:32 PM    LOS: 2 days

## 2015-02-16 NOTE — Progress Notes (Signed)
Pt pulled off tele leadset multiple times. Reapplied multiple times and had to apply it on the back. IV line was pulled out when walked in room. New IV line restarted.   Pt got out of bed and wandered off in hallway and was caught drinking water fountain. Redirected pt to room, cooperative with instruction. Pt vomited watery black emesis with C/O of stomach ache.  NP notified with new order. Will continue to monitor.

## 2015-02-16 NOTE — Progress Notes (Signed)
OT Cancellation Note  Patient Details Name: [name] MRN: [id number] DOB: [birth year]   Cancelled Treatment:    Reason Eval/Treat Not Completed:  Pt on bed rest. Please update activity orders when pt appropriate for OT evaluation.  Earlie Raveling OTR/L [phone] 02/16/2015, 3:10 PM

## 2015-02-16 NOTE — Progress Notes (Signed)
PT Cancellation Note  Patient Details Name: Albert Donovan MRN: [id number] DOB: 12-09-1955   Cancelled Treatment:    Reason Eval/Treat Not Completed: Medical issues which prohibited therapy Remains on bed rest orders this afternoon. Will follow-up when medically ready for comprehensive PT evaluation when medically ready.  Berton Mount 02/16/2015, 3:16 PM Sunday [place], [place] [phone]

## 2015-02-17 DIAGNOSIS — R4781 Slurred speech: Secondary | ICD-10-CM

## 2015-02-17 MED ORDER — DM-GUAIFENESIN ER 30-600 MG PO TB12: 1.0000 | ORAL_TABLET | Freq: Two times a day (BID) | ORAL | Status: DC

## 2015-02-17 MED ORDER — ALBUTEROL SULFATE (2.5 MG/3ML) 0.083% IN NEBU: 2.5000 mg | INHALATION_SOLUTION | RESPIRATORY_TRACT | Status: AC | PRN

## 2015-02-17 MED ORDER — DOXYCYCLINE HYCLATE 100 MG PO TABS: 100.0000 mg | ORAL_TABLET | Freq: Two times a day (BID) | ORAL | Status: DC

## 2015-02-17 NOTE — Discharge Summary (Signed)
PATIENT DETAILS Name: Albert Donovan Age: 59 y.o. Sex: male Date of Birth: 06-10-1955 MRN: [id number]. Admitting Physician: Lorretta Harp, MD PCP:No primary care provider on file.  Admit Date: 02/14/2015 Discharge date: 02/17/2015  Recommendations for Outpatient Follow-up:  1. Please repeat chest x-ray in 6-8 weeks to document resolution of pneumonia.  2. Please follow blood/urine cultures till final  PRIMARY DISCHARGE DIAGNOSIS:  Principal Problem:   Aspiration pneumonia Active Problems:   PE (pulmonary embolism)   MR (mental retardation)   Schizophrenia   Asthma   Weakness   Slurred speech   Abdominal pain   AKI (acute kidney injury)   HCAP (healthcare-associated pneumonia)   Dysarthria      PAST MEDICAL HISTORY: Past Medical History  Diagnosis Date  . PE (pulmonary embolism)   . MR (mental retardation)   . Schizophrenia   . Asthma     DISCHARGE MEDICATIONS: Current Discharge Medication List    START taking these medications   Details  albuterol (PROVENTIL) (2.5 MG/3ML) 0.083% nebulizer solution Take 3 mLs (2.5 mg total) by nebulization every 4 (four) hours as needed for wheezing or shortness of breath. Qty: 75 mL, Refills: 0    dextromethorphan-guaiFENesin (MUCINEX DM) 30-600 MG per 12 hr tablet Take 1 tablet by mouth 2 (two) times daily. Qty: 10 tablet, Refills: 0      CONTINUE these medications which have CHANGED   Details  doxycycline (VIBRA-TABS) 100 MG tablet Take 1 tablet (100 mg total) by mouth 2 (two) times daily. FOR 7 MORE DAYS FROM 9/25 AND THEN STOP Qty: 14 tablet, Refills: 0      CONTINUE these medications which have NOT CHANGED   Details  antipyrine-benzocaine (AURALGAN) otic solution Place 3-4 drops into the left ear every 4 (four) hours as needed for ear pain.    aspirin EC 81 MG tablet Take 81 mg by mouth daily.    benztropine (COGENTIN) 1 MG tablet Take 0.5 mg by mouth 2 (two) times daily.    betamethasone dipropionate (DIPROLENE)  0.05 % cream Apply 1 application topically daily.    clonazePAM (KLONOPIN) 1 MG tablet Take 1 mg by mouth 2 (two) times daily.    gabapentin (NEURONTIN) 300 MG capsule Take 300 mg by mouth 3 (three) times daily.    haloperidol (HALDOL) 1 MG tablet Take 1 mg by mouth 3 (three) times daily.    l-methylfolate-B6-B12 (METANX) 3-35-2 MG TABS Take 1 tablet by mouth daily.    levothyroxine (SYNTHROID, LEVOTHROID) 25 MCG tablet Take 25 mcg by mouth daily before breakfast.    phenol (CHLORASEPTIC) 1.4 % LIQD Use as directed 2 sprays in the mouth or throat every 6 (six) hours as needed for throat irritation / pain.    pravastatin (PRAVACHOL) 20 MG tablet Take 20 mg by mouth at bedtime.    Pseudoeph-Doxylamine-DM-APAP (NYQUIL PO) Take 10 mLs by mouth at bedtime as needed (for sore throat).    QUEtiapine (SEROQUEL) 50 MG tablet Take 50 mg by mouth 3 (three) times daily.    rivaroxaban (XARELTO) 20 MG TABS tablet Take 20 mg by mouth daily with supper.    sodium chloride (OCEAN) 0.65 % SOLN nasal spray Place 2 sprays into both nostrils as needed for congestion.    temazepam (RESTORIL) 30 MG capsule Take 30 mg by mouth at bedtime.        ALLERGIES:   Allergies  Allergen Reactions  . Trifluoperazine Hcl   . Tramadol     BRIEF HPI:  See  H&P, Labs, Consult and Test reports for all details in brief, patient is a 59 y.o. male with PMH of mental retardation, asthma, schizophrenia, PE, who presents with right arm weakness, slurred speech, shortness of breath, abdominal pain,   CONSULTATIONS:   None  PERTINENT RADIOLOGIC STUDIES: Ct Head Wo Contrast  02/14/2015   CLINICAL DATA:  59 yr old male with worsening slurred speech, intermittent right sided weakness, generalized fatigue for the past week, also elevated HR, low BP.  EXAM: CT HEAD WITHOUT CONTRAST  TECHNIQUE: Contiguous axial images were obtained from the base of the skull through the vertex without intravenous contrast.  COMPARISON:   None.  FINDINGS: Ventricles are normal configuration. There is ventricular and sulcal enlargement reflecting volume loss somewhat greater than generally seen in this patient's age group. There is no hydrocephalus.  There are no parenchymal masses or mass effect. There is no evidence of a cortical infarct.  There are no extra-axial masses or abnormal fluid collections.  There is no intracranial hemorrhage.  Visualized sinuses and mastoid air cells are clear. No skull lesion.  IMPRESSION: 1. No acute intracranial abnormalities. 2. Volume loss somewhat greater than expected for age. No other abnormality.   Electronically Signed   By: Amie Portland M.D.   On: 02/14/2015 20:11   Ct Angio Chest Pe W/cm &/or Wo Cm  02/14/2015   CLINICAL DATA:  Abdominal pain radiating into the back.  EXAM: CT ANGIOGRAPHY CHEST  CT ABDOMEN AND PELVIS WITH CONTRAST  TECHNIQUE: Multidetector CT imaging of the chest was performed using the standard protocol during bolus administration of intravenous contrast. Multiplanar CT image reconstructions and MIPs were obtained to evaluate the vascular anatomy. Multidetector CT imaging of the abdomen and pelvis was performed using the standard protocol during bolus administration of intravenous contrast.  CONTRAST:  OMNIPAQUE IOHEXOL 350 MG/ML SOLN  COMPARISON:  None.  FINDINGS: CTA CHEST FINDINGS  Technically adequate study with moderately good opacification of the central and proximal segmental pulmonary arteries. No focal filling defects demonstrated. No evidence of significant pulmonary embolus.  Normal heart size. Normal caliber thoracic aorta. No evidence of aortic dissection. Mild calcification in the aorta. Great vessel origins are patent. Mediastinal lymph nodes are not pathologically enlarged. Esophagus is decompressed. Small esophageal hiatal hernia.  Small right pleural effusion with basilar infiltration and volume loss, possibly pneumonia. Mild dependent changes in the left lung  base. No pneumothorax.  CT ABDOMEN and PELVIS FINDINGS  The liver, spleen, gallbladder, pancreas, adrenal glands, abdominal aorta, inferior vena cava, and retroperitoneal lymph nodes are unremarkable. Sub cm low-attenuation lesion in the right kidney probably represents a cyst although too small to characterize. 2 mm stone in the lower pole left kidney. No hydronephrosis or hydroureter. Stomach, small bowel, and colon are not abnormally distended. No free air or free fluid in the abdomen.  Pelvis: Appendix is not identified. Bladder wall is diffusely thickened possibly suggesting cystitis. Prostate gland is not enlarged. No free or loculated pelvic fluid collections. No pelvic mass or lymphadenopathy. No evidence of diverticulitis. Degenerative changes in the spine. No destructive bone lesions.  Review of the MIP images confirms the above findings.  IMPRESSION: No evidence of significant pulmonary embolus. No evidence of aortic dissection. Consolidation in the right lung base with small pleural effusion, possibly pneumonia.  Nonobstructing stone in the left kidney. Diffuse bladder wall thickening suggesting cystitis. No evidence of bowel obstruction or inflammation.   Electronically Signed   By: Marisa Cyphers.D.  On: 02/14/2015 22:08   Mr Brain Wo Contrast  02/15/2015   CLINICAL DATA:  59 year old mentally handicapped male presents with right upper extremity weakness, slurred speech, shortness of breath, hypotension. Initial encounter.  EXAM: MRI HEAD WITHOUT CONTRAST  TECHNIQUE: Multiplanar, multiecho pulse sequences of the brain and surrounding structures were obtained without intravenous contrast.  COMPARISON:  Head CT without contrast 02/14/2015  FINDINGS: Intermittently motion degraded and discontinued prior to completion due to patient agitation. Coronal T2 weighted imaging was not obtained.  No convincing restricted diffusion to suggest acute infarction. No midline shift, mass effect, evidence of  mass lesion, ventriculomegaly, extra-axial collection or acute intracranial hemorrhage. Cervicomedullary junction and pituitary are within normal limits. Negative visualized cervical spine. Major intracranial vascular flow voids are grossly preserved. Allowing for motion, gray and white matter signal appears to be within normal limits for age throughout the brain.  Mild right mastoid effusion. Trace paranasal sinus mucosal thickening. Normal bone marrow signal. Negative visualized orbit and scalp soft tissues.  IMPRESSION: No acute intracranial abnormality identified on this intermittently motion degraded study.   Electronically Signed   By: Odessa Fleming M.D.   On: 02/15/2015 20:30   Ct Abdomen Pelvis W Contrast  02/14/2015   CLINICAL DATA:  Abdominal pain radiating into the back.  EXAM: CT ANGIOGRAPHY CHEST  CT ABDOMEN AND PELVIS WITH CONTRAST  TECHNIQUE: Multidetector CT imaging of the chest was performed using the standard protocol during bolus administration of intravenous contrast. Multiplanar CT image reconstructions and MIPs were obtained to evaluate the vascular anatomy. Multidetector CT imaging of the abdomen and pelvis was performed using the standard protocol during bolus administration of intravenous contrast.  CONTRAST:  OMNIPAQUE IOHEXOL 350 MG/ML SOLN  COMPARISON:  None.  FINDINGS: CTA CHEST FINDINGS  Technically adequate study with moderately good opacification of the central and proximal segmental pulmonary arteries. No focal filling defects demonstrated. No evidence of significant pulmonary embolus.  Normal heart size. Normal caliber thoracic aorta. No evidence of aortic dissection. Mild calcification in the aorta. Great vessel origins are patent. Mediastinal lymph nodes are not pathologically enlarged. Esophagus is decompressed. Small esophageal hiatal hernia.  Small right pleural effusion with basilar infiltration and volume loss, possibly pneumonia. Mild dependent changes in the left lung  base. No pneumothorax.  CT ABDOMEN and PELVIS FINDINGS  The liver, spleen, gallbladder, pancreas, adrenal glands, abdominal aorta, inferior vena cava, and retroperitoneal lymph nodes are unremarkable. Sub cm low-attenuation lesion in the right kidney probably represents a cyst although too small to characterize. 2 mm stone in the lower pole left kidney. No hydronephrosis or hydroureter. Stomach, small bowel, and colon are not abnormally distended. No free air or free fluid in the abdomen.  Pelvis: Appendix is not identified. Bladder wall is diffusely thickened possibly suggesting cystitis. Prostate gland is not enlarged. No free or loculated pelvic fluid collections. No pelvic mass or lymphadenopathy. No evidence of diverticulitis. Degenerative changes in the spine. No destructive bone lesions.  Review of the MIP images confirms the above findings.  IMPRESSION: No evidence of significant pulmonary embolus. No evidence of aortic dissection. Consolidation in the right lung base with small pleural effusion, possibly pneumonia.  Nonobstructing stone in the left kidney. Diffuse bladder wall thickening suggesting cystitis. No evidence of bowel obstruction or inflammation.   Electronically Signed   By: Burman Nieves M.D.   On: 02/14/2015 22:08     PERTINENT LAB RESULTS: CBC:  Recent Labs  02/14/15 2004 02/16/15 0556  WBC 7.6 5.8  HGB 12.5* 11.5*  HCT 38.9* 35.4*  PLT 267 187   CMET CMP     Component Value Date/Time   NA 137 02/16/2015 0556   K 3.7 02/16/2015 0556   CL 102 02/16/2015 0556   CO2 28 02/16/2015 0556   GLUCOSE 104* 02/16/2015 0556   BUN <5* 02/16/2015 0556   CREATININE 0.98 02/16/2015 0556   CALCIUM 8.2* 02/16/2015 0556   PROT 6.9 02/14/2015 2004   ALBUMIN 2.8* 02/14/2015 2004   AST 16 02/14/2015 2004   ALT 10* 02/14/2015 2004   ALKPHOS 96 02/14/2015 2004   BILITOT 0.9 02/14/2015 2004   GFRNONAA >60 02/16/2015 0556   GFRAA >60 02/16/2015 0556    GFR Estimated Creatinine  Clearance: 109.9 mL/min (by C-G formula based on Cr of 0.98).  Recent Labs  02/15/15 0126  LIPASE 38   No results for input(s): CKTOTAL, CKMB, CKMBINDEX, TROPONINI in the last 72 hours. Invalid input(s): POCBNP No results for input(s): DDIMER in the last 72 hours.  Recent Labs  02/15/15 0508  HGBA1C 5.7*    Recent Labs  02/15/15 0508  CHOL 132  HDL 41  LDLCALC 75  TRIG 79  CHOLHDL 3.2   No results for input(s): TSH, T4TOTAL, T3FREE, THYROIDAB in the last 72 hours.  Invalid input(s): FREET3 No results for input(s): VITAMINB12, FOLATE, FERRITIN, TIBC, IRON, RETICCTPCT in the last 72 hours. Coags:  Recent Labs  02/14/15 2004  INR 2.93*   Microbiology: Recent Results (from the past 240 hour(s))  Blood culture (routine x 2)     Status: None (Preliminary result)   Collection Time: 02/14/15  7:41 PM  Result Value Ref Range Status   Specimen Description BLOOD RIGHT ARM  Final   Special Requests BOTTLES DRAWN AEROBIC AND ANAEROBIC  Final   Culture NO GROWTH 2 DAYS  Final   Report Status PENDING  Incomplete  Blood culture (routine x 2)     Status: None (Preliminary result)   Collection Time: 02/14/15  7:51 PM  Result Value Ref Range Status   Specimen Description BLOOD RIGHT FOREARM  Final   Special Requests BOTTLES DRAWN AEROBIC AND ANAEROBIC  Final   Culture NO GROWTH 2 DAYS  Final   Report Status PENDING  Incomplete  Urine culture     Status: None   Collection Time: 02/14/15 10:50 PM  Result Value Ref Range Status   Specimen Description URINE, CLEAN CATCH  Final   Special Requests NONE  Final   Culture MULTIPLE SPECIES PRESENT, SUGGEST RECOLLECTION  Final   Report Status 02/16/2015 FINAL  Final     BRIEF HOSPITAL COURSE:  Healthcare associated pneumonia: With suspected aspiration component. Recent hospitalization in [facility]-for similar issue, had thoracocentesis-pleural fluid culture positive for MRSA. Doing well, afebrile, sleeping  comfortably this morning. Managed with intravenous vancomycin and Zosyn so far, blood cultures continue to be negative. We will plan on narrowing to just doxycycline on discharge. Please follow blood culture still final. Please obtain a two-view chest x-ray in 6-8 weeks to document resolution of the infiltrate. Patient was seen by speech therapy, recommendations are to continue with a regular diet.   Active Problems: Right arm weakness/slurred speech: Resolved. Suspect this was secondary to metabolic abnormalities and pneumonia. MRI brain negative for CVA. Neurology was consulted during this hospital stay. Suspect no further workup required at this time.  Abdominal pain: Resolved. CT abdomen/pelvis on admission and negative for acute abnormalities. Abdomen is soft, nontender on exam. Tolerating  diet without any major issues. Etiology of abdominal pain remains unknown-CT abdomen did show a nonobstructing ureteral stone-? Passed stone  ARF: Secondary to prerenal azotemia, resolved with hydration.   History of pulmonary embolism: Continue xarelto. CTA neg for new PE  Mental Retardation complicated by Schizophrenia: Haldol and Seroquel initially on hold due to prolonged QT. QT now within normal limits. Continue Seroquel, Haldol, Klonopin. Currently stable-quiet and calm. Please ensure  follow-up with psychiatry as outpatient  TODAY-DAY OF DISCHARGE:  Subjective:   Albert Donovan today has no headache,no chest abdominal pain,no new weakness tingling or numbness, feels much better wants to go home today.   Objective:   Blood pressure 120/67, pulse 80, temperature 97.8 F (36.6 C), temperature source Oral, resp. rate 18, height 6' (1.829 m), weight 122.925 kg (271 lb), SpO2 96 %. No intake or output data in the 24 hours ending 02/17/15 0948 Filed Weights   02/15/15 0000  Weight: 122.925 kg (271 lb)    Exam Awake Alert, Oriented *3, No new F.N deficits [place].AT,PERRAL Supple Neck,No JVD, No  cervical lymphadenopathy appriciated.  Symmetrical Chest wall movement, Good air movement bilaterally, CTAB RRR,No Gallops,Rubs or new Murmurs, No Parasternal Heave +ve B.Sounds, Abd Soft, Non tender, No organomegaly appriciated, No rebound -guarding or rigidity. No Cyanosis, Clubbing or edema, No new Rash or bruise  DISCHARGE CONDITION: Stable  DISPOSITION: Group Home  DISCHARGE INSTRUCTIONS:    Activity:  As tolerated   Get Medicines reviewed and adjusted: Please take all your medications with you for your next visit with your Primary MD  Please request your Primary MD to go over all hospital tests and procedure/radiological results at the follow up, please ask your Primary MD to get all Hospital records sent to his/her office.  If you experience worsening of your admission symptoms, develop shortness of breath, life threatening emergency, suicidal or homicidal thoughts you must seek medical attention immediately by calling 911 or calling your MD immediately  if symptoms less severe.  You must read complete instructions/literature along with all the possible adverse reactions/side effects for all the Medicines you take and that have been prescribed to you. Take any new Medicines after you have completely understood and accpet all the possible adverse reactions/side effects.   Do not drive when taking Pain medications.   Do not take more than prescribed Pain, Sleep and Anxiety Medications  Special Instructions: If you have smoked or chewed Tobacco  in the last 2 yrs please stop smoking, stop any regular Alcohol  and or any Recreational drug use.  Wear Seat belts while driving.  Please note  You were cared for by a hospitalist during your hospital stay. Once you are discharged, your primary care physician will handle any further medical issues. Please note that NO REFILLS for any discharge medications will be authorized once you are discharged, as it is imperative that you return  to your primary care physician (or establish a relationship with a primary care physician if you do not have one) for your aftercare needs so that they can reassess your need for medications and monitor your lab values.   Diet recommendation: Regular Diet  Discharge Instructions    Call MD for:  difficulty breathing, headache or visual disturbances    Complete by:  As directed      Diet - low sodium heart healthy    Complete by:  As directed      Increase activity slowly    Complete by:  As directed  Follow-up Information    Schedule an appointment as soon as possible for a visit in 1 week to follow up.   Contact information:   Primary MD      Total Time spent on discharge equals 45 minutes.  SignedJeoffrey Massed 02/17/2015 9:48 AM

## 2015-02-17 NOTE — Evaluation (Signed)
Physical Therapy Evaluation and Discharge Patient Details Name: Albert Donovan MRN: [id number] DOB: 1956/03/20 Today's Date: 02/17/2015   History of Present Illness  59 y.o. male with history of mental retardation and schizophrenia admitted with aspiration pneumonia.  Clinical Impression  Patient evaluated by Physical Therapy with no further acute PT needs identified. All education has been completed and the patient has no further questions. Poor historian of prior level of function. Ambulates generally well today, without an assistive device demonstrating only minor sway which is likely due to his grogginess. Slightly impulsive with transfers but did not require any assistance. See below for any follow-up Physial Therapy or equipment needs. PT is signing off. Thank you for this referral.     Follow Up Recommendations No PT follow up;Supervision - Intermittent    Equipment Recommendations  None recommended by PT    Recommendations for Other Services       Precautions / Restrictions Precautions Precautions: None Restrictions Weight Bearing Restrictions: No      Mobility  Bed Mobility Overal bed mobility: Modified Independent             General bed mobility comments: extra time  Transfers Overall transfer level: Needs assistance Equipment used: None Transfers: Sit to/from Stand Sit to Stand: Supervision         General transfer comment: Impulsive to stand with mild sway but able to self correct without need for assist. Performed from lowest bed setting and low toilet. Cues for awareness and supervision for safety.  Ambulation/Gait Ambulation/Gait assistance: Supervision Ambulation Distance (Feet): 200 Feet Assistive device: None Gait Pattern/deviations: Step-through pattern;Wide base of support Gait velocity: decreased Gait velocity interpretation: Below normal speed for age/gender General Gait Details: Minimal sway noted, no need for physical assist from PT,  no overt loss of balance, and did not require an assistive device. Cues for awareness of stability. No difficulty with turns.  Stairs            Wheelchair Mobility    Modified Rankin (Stroke Patients Only)       Balance Overall balance assessment: Needs assistance Sitting-balance support: No upper extremity supported;Feet supported Sitting balance-Leahy Scale: Good     Standing balance support: No upper extremity supported Standing balance-Leahy Scale: Good                               Pertinent Vitals/Pain Pain Assessment: No/denies pain    Home Living Family/patient expects to be discharged to:: Group home Living Arrangements: Group Home Available Help at Discharge: Other (Comment) (group home staff) Type of Home: Group Home Home Access: Level entry     Home Layout: One level Home Equipment: None      Prior Function Level of Independence: Needs assistance   Gait / Transfers Assistance Needed: Ind  ADL's / Homemaking Assistance Needed: House staff cooks meals. Pt can bath/dress himself        Hand Dominance   Dominant Hand: Right    Extremity/Trunk Assessment   Upper Extremity Assessment: Defer to OT evaluation           Lower Extremity Assessment: Overall WFL for tasks assessed         Communication   Communication: Expressive difficulties  Cognition Arousal/Alertness: Awake/alert Behavior During Therapy: Flat affect;Impulsive Overall Cognitive Status: History of cognitive impairments - at baseline (Oriented to self and location - unsure of date)  General Comments General comments (skin integrity, edema, etc.): A bit groggy but following commands appropriately. Poor historian of PLOF but does report he has not had any falls that he can recall. States he performs majority of ADLs and likes to watch TV in his spare time. Staff at group home prepares meals for patient.    Exercises         Assessment/Plan    PT Assessment Patent does not need any further PT services  PT Diagnosis Abnormality of gait   PT Problem List    PT Treatment Interventions     PT Goals (Current goals can be found in the Care Plan section) Acute Rehab PT Goals Patient Stated Goal: none stated PT Goal Formulation: All assessment and education complete, DC therapy    Frequency     Barriers to discharge        Co-evaluation               End of Session   Activity Tolerance: Patient tolerated treatment well Patient left: in bed;with call bell/phone within reach Nurse Communication: Mobility status         Time: 1610-9604 PT Time Calculation (min) (ACUTE ONLY): 14 min   Charges:   PT Evaluation $Initial PT Evaluation Tier I: 1 Procedure     PT G CodesBerton Mount 02/17/2015, 10:17 AM  Charlsie Merles, PT [phone]

## 2015-02-17 NOTE — Social Work (Signed)
CSW has made multiple attempts to contact the patient's brother. Call was disconnected within minutes of starting first successful call. CSW has called 3 times since without further information.  CSW attempted to call the group home found by patient's address. This CSW called 3 times with no answer.  CSW will discuss further with the patient to coordinate discharge plan.   Beverly Sessions MSW, LCSW [phone]

## 2015-02-17 NOTE — Evaluation (Addendum)
Occupational Therapy Evaluation Patient Details Name: Albert Donovan MRN: [id number] DOB: 1955/11/15 Today's Date: 02/17/2015    History of Present Illness 59 y.o. male with history of mental retardation and schizophrenia who was recently hospitalized for aspiration pneumonia. Admitted 9/22 with right arm weakness, slurred speech, shortness of breath, abdominal pain, lowblood pressure.   Clinical Impression   Pt admitted with above. Unsure of pt's true, PLOF. Planning to d/c today and pt is from group home. OT signing off and recommend 24/7 assist/supervision upon d/c.    Follow Up Recommendations  No OT follow up;Supervision/Assistance - 24 hour    Equipment Recommendations    Feel pt will benefit from shower chair if he does not have one, but pt reports he doesn't want one    Recommendations for Other Services       Precautions / Restrictions Precautions Precautions: Fall Restrictions Weight Bearing Restrictions: No      Mobility Bed Mobility Overal bed mobility: Modified Independent              Transfers Overall transfer level: Needs assistance Equipment used: None Transfers: Sit to/from Stand Sit to Stand: Supervision           Balance No LOB in session.                         ADL Overall ADL's : Needs assistance/impaired     Grooming: Oral care;Set up;Supervision/safety;Sitting;Standing;Applying deodorant               Lower Body Dressing: Set up;Sit to/from stand;Bed level;Supervision/safety   Toilet Transfer: Supervision/safety;Ambulation (sit to stand from bed and chair)           Functional mobility during ADLs: Supervision/safety       Vision     Perception     Praxis      Pertinent Vitals/Pain Pain Assessment: Faces Faces Pain Scale: Hurts little more Pain Location: bottom Pain Intervention(s): Monitored during session     Hand Dominance Right   Extremity/Trunk Assessment Upper Extremity  Assessment Upper Extremity Assessment: Overall WFL for tasks assessed   Lower Extremity Assessment Lower Extremity Assessment: Defer to PT evaluation       Communication Communication Communication: Expressive difficulties   Cognition Arousal/Alertness: Awake/alert Behavior During Therapy: Flat affect Overall Cognitive Status: History of cognitive impairments - at baseline; not oriented to time; cues for problem solving in putting on deodorant.                     General Comments       Exercises       Shoulder Instructions      Home Living Family/patient expects to be discharged to:: Group home Living Arrangements: Group Home Available Help at Discharge: Other (Comment) Type of Home: Group Home Home Access: Level entry     Home Layout: One level               Home Equipment: None          Prior Functioning/Environment Level of Independence: Needs assistance  Gait / Transfers Assistance Needed: independent ADL's / Homemaking Assistance Needed: House staff cooks meals. Pt can bath/dress himself        OT Diagnosis: Admitted with weakness and pain   OT Problem List:     OT Treatment/Interventions:      OT Goals(Current goals can be found in the care plan section) Acute Rehab OT Goals Patient Stated Goal: pt  wanted to sleep  OT Frequency:     Barriers to D/C:            Co-evaluation              End of Session    Activity Tolerance: Patient limited by lethargy Patient left: in bed;with call bell/phone within reach;with bed alarm set   Time: 1056-1106 OT Time Calculation (min): 10 min Charges:  OT General Charges $OT Visit: 1 Procedure OT Evaluation $Initial OT Evaluation Tier I: 1 Procedure G-CodesEarlie Raveling OTR/L [phone] 02/17/2015, 11:15 AM

## 2015-02-17 NOTE — Social Work (Signed)
CSW received a call back from Group Home worker. She stated that the patient was not to return to the same group home but relocating to another and that the supervisor would be able to provide better information. She provided the number for [name], supervisor at [phone]. Ms Laural Benes stated that she is not sure if patient is appropriate for the level of independent living that was available at the group home. She stated that she believes that the patent has been to multiple hospitals treating the same condition (pneumonia) because the group home does not provide an adequate level of medical monitoring and care. "we don't watch him to make sure he's not sitting up while drinking...that's why he keeps going back to the hospital." She stated that [facility] is involved in order to support transition into another level of placement. The person from [place] that is involved is Renard Matter at [phone]. This CSW called and left her a message to call Weekend CSW today or Weekday CSW [phone]) if she calls back on Monday.  CSW still waiting to hear further from brother in order to identify an appropriate discharge plan. Patient does not have any skilled needs in order to support SNF placement.  CSW attempted to discuss with patient but he was not able to discuss his plan clearly and was a poor historian. CSW will continue to follow.   Beverly Sessions MSW, LCSW [phone]

## 2015-02-18 DIAGNOSIS — M6289 Other specified disorders of muscle: Secondary | ICD-10-CM

## 2015-02-18 DIAGNOSIS — R4701 Aphasia: Secondary | ICD-10-CM

## 2015-02-18 MED ORDER — DOXYCYCLINE HYCLATE 100 MG PO TABS
100.0000 mg | ORAL_TABLET | Freq: Two times a day (BID) | ORAL | Status: DC
  Administered 2015-02-18: 100 mg via ORAL
  Filled 2015-02-18: qty 1

## 2015-02-18 NOTE — Progress Notes (Signed)
PATIENT DETAILS Name: Albert Donovan Age: 59 y.o. Sex: male Date of Birth: 04/10/1956 Admit Date: 02/14/2015 Admitting Physician Lorretta Harp, MD PCP:No primary care provider on file.  Subjective: Lying comfortably in bed. Awake/Alert. Follows commands. Moves all 4 extremities.  Assessment/Plan: Principal Problem: Healthcare associated pneumonia: With suspected aspiration component. Recent hospitalization in [facility]-for similar issue, had thoracocentesis-pleural fluid culture positive for MRSA. Remains afebrile, without major issues., Transitioned to doxycycline and discontinue both Zosyn and vancomycin. Stable for discharge. See discharge summary done on 9/25.  Active Problems: Right arm weakness/slurred speech: Resolved. Suspect this was secondary to metabolic abnormalities and pneumonia. MRI brain negative for CVA. Suspect no further workup required at this time.  Abdominal pain: Resolved. CT abdomen/pelvis on admission and negative for acute abnormalities. Abdomen is soft, nontender on exam. Etiology of abdominal pain remains unknown-CT abdomen did show a nonobstructing ureteral stone-? Passed stone  ARF: Secondary to prerenal azotemia, resolved with hydration. Stop IV fluids.  History of pulmonary embolism: Continue xarelto. CTA neg for new PE  Mental Retardation complicated by Schizophrenia: Haldol and Seroquel initially on hold due to prolonged QT. QT now within normal limits. Continue Seroquel, Haldol, Klonopin. Currently stable-quiet and calm.  Disposition: SNF today  Antimicrobial agents  See below  Anti-infectives    Start     Dose/Rate Route Frequency Ordered Stop   02/17/15 0000  doxycycline (VIBRA-TABS) 100 MG tablet     100 mg Oral 2 times daily 02/17/15 0947     02/15/15 1200  vancomycin (VANCOCIN) IVPB 1000 mg/200 mL premix     1,000 mg 200 mL/hr over 60 Minutes Intravenous Every 12 hours 02/15/15 0028     02/15/15 0600   piperacillin-tazobactam (ZOSYN) IVPB 3.375 g     3.375 g 12.5 mL/hr over 240 Minutes Intravenous Every 8 hours 02/15/15 0017     02/15/15 0030  vancomycin (VANCOCIN) IVPB 1000 mg/200 mL premix     1,000 mg 200 mL/hr over 60 Minutes Intravenous  Once 02/15/15 0028 02/15/15 0301   02/15/15 0015  piperacillin-tazobactam (ZOSYN) IVPB 3.375 g  Status:  Discontinued     3.375 g 100 mL/hr over 30 Minutes Intravenous 3 times per day 02/15/15 0014 02/15/15 0017   02/15/15 0015  vancomycin (VANCOCIN) IVPB 750 mg/150 ml premix  Status:  Discontinued     750 mg 150 mL/hr over 60 Minutes Intravenous Every 12 hours 02/15/15 0014 02/15/15 0017   02/14/15 2230  vancomycin (VANCOCIN) IVPB 1000 mg/200 mL premix     1,000 mg 200 mL/hr over 60 Minutes Intravenous  Once 02/14/15 2221 02/15/15 0034   02/14/15 2230  piperacillin-tazobactam (ZOSYN) IVPB 3.375 g     3.375 g 100 mL/hr over 30 Minutes Intravenous  Once 02/14/15 2221 02/14/15 2328      DVT Prophylaxis: Xarelto  Code Status: Full code   Family Communication None at bedsdie  Procedures: None  CONSULTS:  Neurology  Time spent 15 minutes-Greater than 50% of this time was spent in counseling, explanation of diagnosis, planning of further management, and coordination of care.  MEDICATIONS: Scheduled Meds: .  stroke: mapping our early stages of recovery book   Does not apply Once  . benztropine  0.5 mg Oral BID  . clonazePAM  1 mg Oral BID  . dextromethorphan-guaiFENesin  1 tablet Oral BID  . gabapentin  300 mg Oral TID  . haloperidol  1 mg Oral TID  .  pantoprazole  40 mg Oral BID  . piperacillin-tazobactam (ZOSYN)  IV  3.375 g Intravenous Q8H  . QUEtiapine  50 mg Oral TID  . rivaroxaban  20 mg Oral Q supper  . temazepam  30 mg Oral QHS  . vancomycin  1,000 mg Intravenous Q12H   Continuous Infusions:  PRN Meds:.albuterol, LORazepam, morphine injection, ondansetron, senna-docusate    PHYSICAL EXAM: Vital signs in last 24  hours: Filed Vitals:   02/17/15 1732 02/17/15 2223 02/18/15 0625 02/18/15 0957  BP: 112/67 103/48 105/56 100/53  Pulse: 80 76 81 71  Temp: 97.2 F (36.2 C) 97.9 F (36.6 C) 97.7 F (36.5 C) 98.4 F (36.9 C)  TempSrc: Oral Oral Oral Oral  Resp: Height:      Weight:      SpO2: 100% 96% 97% 97%    Weight change:  Filed Weights   02/15/15 0000  Weight: 122.925 kg (271 lb)   Body mass index is 36.75 kg/(m^2).   Gen Exam: Awake and alert-not in any distress having lying comfortably in bed. Following all commands Neck: Supple, No JVD.   Chest: B/L Clear.   CVS: S1 S2 Regular, no murmurs.  Abdomen: soft, BS +, non tender, non distended.  Extremities: no edema, lower extremities warm to touch. Neurologic: Moves all 4 limbs on command Skin: No Rash.   Wounds: N/A.    Intake/Output from previous day:  Intake/Output Summary (Last 24 hours) at 02/18/15 1124 Last data filed at 02/18/15 0738  Gross per 24 hour  Intake    120 ml  Output      0 ml  Net    120 ml     LAB RESULTS: CBC  Recent Labs Lab 02/14/15 1951 02/14/15 2004 02/16/15 0556  WBC  --  7.6 5.8  HGB 13.6 12.5* 11.5*  HCT 40.0 38.9* 35.4*  PLT  --  267 187  MCV  --  91.5 92.4  MCH  --  29.4 30.0  MCHC  --  32.1 32.5  RDW  --  14.2 14.2  LYMPHSABS  --  1.6  --   MONOABS  --  1.0  --   EOSABS  --  0.1  --   BASOSABS  --  0.0  --     Chemistries   Recent Labs Lab 02/14/15 1951 02/14/15 2004 02/15/15 1419 02/16/15 0556  NA 141 137 144 137  K 3.7 3.5 4.2 3.7  CL 99* 100* 108 102  CO2  --  GLUCOSE 115* 116* 105* 104*  BUN 7 6 <5* <5*  CREATININE 1.30* 1.33* 1.09 0.98  CALCIUM  --  9.0 9.0 8.2*  MG  --   --  1.7 1.6*    CBG: No results for input(s): GLUCAP in the last 168 hours.  GFR Estimated Creatinine Clearance: 109.9 mL/min (by C-G formula based on Cr of 0.98).  Coagulation profile  Recent Labs Lab 02/14/15 2004  INR 2.93*    Cardiac Enzymes No  results for input(s): CKMB, TROPONINI, MYOGLOBIN in the last 168 hours.  Invalid input(s): CK  Invalid input(s): POCBNP No results for input(s): DDIMER in the last 72 hours. No results for input(s): HGBA1C in the last 72 hours. No results for input(s): CHOL, HDL, LDLCALC, TRIG, CHOLHDL, LDLDIRECT in the last 72 hours. No results for input(s): TSH, T4TOTAL, T3FREE, THYROIDAB in the last 72 hours.  Invalid input(s): FREET3 No results for input(s): VITAMINB12, FOLATE, FERRITIN, TIBC,  IRON, RETICCTPCT in the last 72 hours. No results for input(s): LIPASE, AMYLASE in the last 72 hours.  Urine Studies No results for input(s): UHGB, CRYS in the last 72 hours.  Invalid input(s): UACOL, UAPR, USPG, UPH, UTP, UGL, UKET, UBIL, UNIT, UROB, ULEU, UEPI, UWBC, URBC, UBAC, CAST, UCOM, BILUA  MICROBIOLOGY: Recent Results (from the past 240 hour(s))  Blood culture (routine x 2)     Status: None (Preliminary result)   Collection Time: 02/14/15  7:41 PM  Result Value Ref Range Status   Specimen Description BLOOD RIGHT ARM  Final   Special Requests BOTTLES DRAWN AEROBIC AND ANAEROBIC  Final   Culture NO GROWTH 3 DAYS  Final   Report Status PENDING  Incomplete  Blood culture (routine x 2)     Status: None (Preliminary result)   Collection Time: 02/14/15  7:51 PM  Result Value Ref Range Status   Specimen Description BLOOD RIGHT FOREARM  Final   Special Requests BOTTLES DRAWN AEROBIC AND ANAEROBIC  Final   Culture NO GROWTH 3 DAYS  Final   Report Status PENDING  Incomplete  Urine culture     Status: None   Collection Time: 02/14/15 10:50 PM  Result Value Ref Range Status   Specimen Description URINE, CLEAN CATCH  Final   Special Requests NONE  Final   Culture MULTIPLE SPECIES PRESENT, SUGGEST RECOLLECTION  Final   Report Status 02/16/2015 FINAL  Final    RADIOLOGY STUDIES/RESULTS: Ct Head Wo Contrast  02/14/2015   CLINICAL DATA:  59 yr old male with worsening slurred speech,  intermittent right sided weakness, generalized fatigue for the past week, also elevated HR, low BP.  EXAM: CT HEAD WITHOUT CONTRAST  TECHNIQUE: Contiguous axial images were obtained from the base of the skull through the vertex without intravenous contrast.  COMPARISON:  None.  FINDINGS: Ventricles are normal configuration. There is ventricular and sulcal enlargement reflecting volume loss somewhat greater than generally seen in this patient's age group. There is no hydrocephalus.  There are no parenchymal masses or mass effect. There is no evidence of a cortical infarct.  There are no extra-axial masses or abnormal fluid collections.  There is no intracranial hemorrhage.  Visualized sinuses and mastoid air cells are clear. No skull lesion.  IMPRESSION: 1. No acute intracranial abnormalities. 2. Volume loss somewhat greater than expected for age. No other abnormality.   Electronically Signed   By: Amie Portland M.D.   On: 02/14/2015 20:11   Ct Angio Chest Pe W/cm &/or Wo Cm  02/14/2015   CLINICAL DATA:  Abdominal pain radiating into the back.  EXAM: CT ANGIOGRAPHY CHEST  CT ABDOMEN AND PELVIS WITH CONTRAST  TECHNIQUE: Multidetector CT imaging of the chest was performed using the standard protocol during bolus administration of intravenous contrast. Multiplanar CT image reconstructions and MIPs were obtained to evaluate the vascular anatomy. Multidetector CT imaging of the abdomen and pelvis was performed using the standard protocol during bolus administration of intravenous contrast.  CONTRAST:  OMNIPAQUE IOHEXOL 350 MG/ML SOLN  COMPARISON:  None.  FINDINGS: CTA CHEST FINDINGS  Technically adequate study with moderately good opacification of the central and proximal segmental pulmonary arteries. No focal filling defects demonstrated. No evidence of significant pulmonary embolus.  Normal heart size. Normal caliber thoracic aorta. No evidence of aortic dissection. Mild calcification in the aorta. Great vessel  origins are patent. Mediastinal lymph nodes are not pathologically enlarged. Esophagus is decompressed. Small esophageal hiatal hernia.  Small right  pleural effusion with basilar infiltration and volume loss, possibly pneumonia. Mild dependent changes in the left lung base. No pneumothorax.  CT ABDOMEN and PELVIS FINDINGS  The liver, spleen, gallbladder, pancreas, adrenal glands, abdominal aorta, inferior vena cava, and retroperitoneal lymph nodes are unremarkable. Sub cm low-attenuation lesion in the right kidney probably represents a cyst although too small to characterize. 2 mm stone in the lower pole left kidney. No hydronephrosis or hydroureter. Stomach, small bowel, and colon are not abnormally distended. No free air or free fluid in the abdomen.  Pelvis: Appendix is not identified. Bladder wall is diffusely thickened possibly suggesting cystitis. Prostate gland is not enlarged. No free or loculated pelvic fluid collections. No pelvic mass or lymphadenopathy. No evidence of diverticulitis. Degenerative changes in the spine. No destructive bone lesions.  Review of the MIP images confirms the above findings.  IMPRESSION: No evidence of significant pulmonary embolus. No evidence of aortic dissection. Consolidation in the right lung base with small pleural effusion, possibly pneumonia.  Nonobstructing stone in the left kidney. Diffuse bladder wall thickening suggesting cystitis. No evidence of bowel obstruction or inflammation.   Electronically Signed   By: Burman Nieves M.D.   On: 02/14/2015 22:08   Mr Brain Wo Contrast  02/15/2015   CLINICAL DATA:  59 year old mentally handicapped male presents with right upper extremity weakness, slurred speech, shortness of breath, hypotension. Initial encounter.  EXAM: MRI HEAD WITHOUT CONTRAST  TECHNIQUE: Multiplanar, multiecho pulse sequences of the brain and surrounding structures were obtained without intravenous contrast.  COMPARISON:  Head CT without contrast  02/14/2015  FINDINGS: Intermittently motion degraded and discontinued prior to completion due to patient agitation. Coronal T2 weighted imaging was not obtained.  No convincing restricted diffusion to suggest acute infarction. No midline shift, mass effect, evidence of mass lesion, ventriculomegaly, extra-axial collection or acute intracranial hemorrhage. Cervicomedullary junction and pituitary are within normal limits. Negative visualized cervical spine. Major intracranial vascular flow voids are grossly preserved. Allowing for motion, gray and white matter signal appears to be within normal limits for age throughout the brain.  Mild right mastoid effusion. Trace paranasal sinus mucosal thickening. Normal bone marrow signal. Negative visualized orbit and scalp soft tissues.  IMPRESSION: No acute intracranial abnormality identified on this intermittently motion degraded study.   Electronically Signed   By: Odessa Fleming M.D.   On: 02/15/2015 20:30   Ct Abdomen Pelvis W Contrast  02/14/2015   CLINICAL DATA:  Abdominal pain radiating into the back.  EXAM: CT ANGIOGRAPHY CHEST  CT ABDOMEN AND PELVIS WITH CONTRAST  TECHNIQUE: Multidetector CT imaging of the chest was performed using the standard protocol during bolus administration of intravenous contrast. Multiplanar CT image reconstructions and MIPs were obtained to evaluate the vascular anatomy. Multidetector CT imaging of the abdomen and pelvis was performed using the standard protocol during bolus administration of intravenous contrast.  CONTRAST:  OMNIPAQUE IOHEXOL 350 MG/ML SOLN  COMPARISON:  None.  FINDINGS: CTA CHEST FINDINGS  Technically adequate study with moderately good opacification of the central and proximal segmental pulmonary arteries. No focal filling defects demonstrated. No evidence of significant pulmonary embolus.  Normal heart size. Normal caliber thoracic aorta. No evidence of aortic dissection. Mild calcification in the aorta. Great vessel  origins are patent. Mediastinal lymph nodes are not pathologically enlarged. Esophagus is decompressed. Small esophageal hiatal hernia.  Small right pleural effusion with basilar infiltration and volume loss, possibly pneumonia. Mild dependent changes in the left lung base. No pneumothorax.  CT ABDOMEN  and PELVIS FINDINGS  The liver, spleen, gallbladder, pancreas, adrenal glands, abdominal aorta, inferior vena cava, and retroperitoneal lymph nodes are unremarkable. Sub cm low-attenuation lesion in the right kidney probably represents a cyst although too small to characterize. 2 mm stone in the lower pole left kidney. No hydronephrosis or hydroureter. Stomach, small bowel, and colon are not abnormally distended. No free air or free fluid in the abdomen.  Pelvis: Appendix is not identified. Bladder wall is diffusely thickened possibly suggesting cystitis. Prostate gland is not enlarged. No free or loculated pelvic fluid collections. No pelvic mass or lymphadenopathy. No evidence of diverticulitis. Degenerative changes in the spine. No destructive bone lesions.  Review of the MIP images confirms the above findings.  IMPRESSION: No evidence of significant pulmonary embolus. No evidence of aortic dissection. Consolidation in the right lung base with small pleural effusion, possibly pneumonia.  Nonobstructing stone in the left kidney. Diffuse bladder wall thickening suggesting cystitis. No evidence of bowel obstruction or inflammation.   Electronically Signed   By: Burman Nieves M.D.   On: 02/14/2015 22:08    Jeoffrey Massed, MD  [facility] Pager:[phone]  If 7PM-7AM, please contact night-coverage [email or web address] Password [email or web address] 02/18/2015, 11:24 AM   LOS: 4 days

## 2015-02-18 NOTE — Clinical Social Work Note (Addendum)
CSW spoke with both [name], supervisor at [phone] and the [facility] case manager Renard Matter at [phone]. CSW explained that the pt has been medical cleared by the MD to return back to the group home. CSW explained that both PT/OT has determine SNF is not apporiate for the pt at this time. Marjean Donna expressed concern regarding taking the pt back to the group home. Marjean Donna reported that she and her supervisor will arrive at the hospital to assess the pt.   CSW informed CSW Chiropodist regarding Marjean Donna concerns.   CSW attempted to call the pt's brother, with no success.   Addendum: CSW spoke with Marjean Donna regarding discharge plan. Marjean Donna reported that she and Kennyth Arnold are working together to make arrangement for the pt at home. CSW is awaiting to hear back from [place].   Addendum: CSW spoke with Marjean Donna about transportation. Marjean Donna reported that she will need to have a meeting with her QM, Biomedical scientist to determine if she can get 24 hours supervision from the pt. Marjean Donna reported that she will not be able to get additional staff today for the pt. Marjean Donna reported that she will take the pt in the morning.   CSW contact CSW Chiropodist to update him regarding Eileen's response to discharge.     CSW will follow up with CSW Chiropodist and Marjean Donna.    Dysheka Bibbs, MSW, LCSWA [phone]

## 2015-02-18 NOTE — Care Management Important Message (Signed)
Important Message  Patient Details  Name: Albert Donovan MRN: [id number] Date of Birth: 22-Oct-1955   Medicare Important Message Given:  Yes-second notification given    Orson Aloe 02/18/2015, 3:19 PM

## 2015-02-19 LAB — CULTURE, BLOOD (ROUTINE X 2)
CULTURE: NO GROWTH
CULTURE: NO GROWTH

## 2015-02-26 ENCOUNTER — Emergency Department (HOSPITAL_COMMUNITY): Payer: Medicare Other

## 2015-02-26 ENCOUNTER — Emergency Department (HOSPITAL_COMMUNITY)
Admission: EM | Admit: 2015-02-26 | Discharge: 2015-03-03 | Disposition: A | Discharge status: Home / Self Care | Payer: Medicare Other | Attending: Emergency Medicine | Admitting: Emergency Medicine

## 2015-02-26 ENCOUNTER — Ambulatory Visit (HOSPITAL_COMMUNITY)
Admission: RE | Admit: 2015-02-26 | Discharge: 2015-02-26 | Disposition: A | Discharge status: Home / Self Care | Payer: Medicare Other | Attending: Psychiatry | Admitting: Psychiatry

## 2015-02-26 ENCOUNTER — Encounter (HOSPITAL_COMMUNITY): Payer: Self-pay | Admitting: Emergency Medicine

## 2015-02-26 DIAGNOSIS — F1310 Sedative, hypnotic or anxiolytic abuse, uncomplicated: Secondary | ICD-10-CM | POA: Insufficient documentation

## 2015-02-26 DIAGNOSIS — F79 Unspecified intellectual disabilities: Secondary | ICD-10-CM | POA: Insufficient documentation

## 2015-02-26 DIAGNOSIS — F203 Undifferentiated schizophrenia: Secondary | ICD-10-CM | POA: Diagnosis not present

## 2015-02-26 DIAGNOSIS — Z79899 Other long term (current) drug therapy: Secondary | ICD-10-CM | POA: Insufficient documentation

## 2015-02-26 DIAGNOSIS — Z86711 Personal history of pulmonary embolism: Secondary | ICD-10-CM | POA: Insufficient documentation

## 2015-02-26 DIAGNOSIS — F209 Schizophrenia, unspecified: Secondary | ICD-10-CM | POA: Insufficient documentation

## 2015-02-26 DIAGNOSIS — J45909 Unspecified asthma, uncomplicated: Secondary | ICD-10-CM | POA: Insufficient documentation

## 2015-02-26 DIAGNOSIS — Z7952 Long term (current) use of systemic steroids: Secondary | ICD-10-CM | POA: Insufficient documentation

## 2015-02-26 DIAGNOSIS — Z7901 Long term (current) use of anticoagulants: Secondary | ICD-10-CM | POA: Insufficient documentation

## 2015-02-26 DIAGNOSIS — Z7982 Long term (current) use of aspirin: Secondary | ICD-10-CM | POA: Insufficient documentation

## 2015-02-26 DIAGNOSIS — Z008 Encounter for other general examination: Secondary | ICD-10-CM | POA: Diagnosis present

## 2015-02-26 LAB — CBC WITH DIFFERENTIAL/PLATELET
BASOS PCT: 0 %
Basophils Absolute: 0 10*3/uL (ref 0.0–0.1)
EOS PCT: 1 %
Eosinophils Absolute: 0.1 10*3/uL (ref 0.0–0.7)
HCT: 34.5 % — ABNORMAL LOW (ref 39.0–52.0)
Hemoglobin: 10.9 g/dL — ABNORMAL LOW (ref 13.0–17.0)
LYMPHS PCT: 16 %
Lymphs Abs: 1.5 10*3/uL (ref 0.7–4.0)
MCH: 29.6 pg (ref 26.0–34.0)
MCHC: 31.6 g/dL (ref 30.0–36.0)
MCV: 93.8 fL (ref 78.0–100.0)
Monocytes Absolute: 1.1 10*3/uL — ABNORMAL HIGH (ref 0.1–1.0)
Monocytes Relative: 11 %
NEUTROS ABS: 7 10*3/uL (ref 1.7–7.7)
Neutrophils Relative %: 72 %
PLATELETS: 199 10*3/uL (ref 150–400)
RBC: 3.68 MIL/uL — AB (ref 4.22–5.81)
RDW: 15.8 % — ABNORMAL HIGH (ref 11.5–15.5)
WBC: 9.6 10*3/uL (ref 4.0–10.5)

## 2015-02-26 LAB — RAPID URINE DRUG SCREEN, HOSP PERFORMED
Amphetamines: NOT DETECTED
BARBITURATES: NOT DETECTED
Benzodiazepines: POSITIVE — AB
Cocaine: NOT DETECTED
Opiates: NOT DETECTED
Tetrahydrocannabinol: NOT DETECTED

## 2015-02-26 LAB — COMPREHENSIVE METABOLIC PANEL
ALBUMIN: 3.3 g/dL — AB (ref 3.5–5.0)
ALK PHOS: 76 U/L (ref 38–126)
ALT: 11 U/L — AB (ref 17–63)
AST: 21 U/L (ref 15–41)
Anion gap: 8 (ref 5–15)
BILIRUBIN TOTAL: 0.9 mg/dL (ref 0.3–1.2)
BUN: 15 mg/dL (ref 6–20)
CALCIUM: 8.6 mg/dL — AB (ref 8.9–10.3)
CO2: 22 mmol/L (ref 22–32)
Chloride: 104 mmol/L (ref 101–111)
Creatinine, Ser: 0.94 mg/dL (ref 0.61–1.24)
GFR calc Af Amer: >60 mL/min (ref >60)
GFR calc non Af Amer: >60 mL/min (ref >60)
GLUCOSE: 130 mg/dL — AB (ref 65–99)
Potassium: 3.8 mmol/L (ref 3.5–5.1)
Sodium: 134 mmol/L — ABNORMAL LOW (ref 135–145)
TOTAL PROTEIN: 6.5 g/dL (ref 6.5–8.1)

## 2015-02-26 LAB — ETHANOL

## 2015-02-26 LAB — URINALYSIS, ROUTINE W REFLEX MICROSCOPIC
Bilirubin Urine: NEGATIVE
Glucose, UA: NEGATIVE mg/dL
Hgb urine dipstick: NEGATIVE
Ketones, ur: NEGATIVE mg/dL
LEUKOCYTES UA: NEGATIVE
NITRITE: NEGATIVE
PH: 6.5 (ref 5.0–8.0)
Protein, ur: NEGATIVE mg/dL
SPECIFIC GRAVITY, URINE: 1.015 (ref 1.005–1.030)
Urobilinogen, UA: 0.2 mg/dL (ref 0.0–1.0)

## 2015-02-26 MED ORDER — BENZTROPINE MESYLATE 1 MG PO TABS
0.5000 mg | ORAL_TABLET | Freq: Two times a day (BID) | ORAL | Status: DC
  Administered 2015-02-26 – 2015-03-03 (×10): 0.5 mg via ORAL
  Filled 2015-02-26 (×10): qty 1

## 2015-02-26 MED ORDER — PRAVASTATIN SODIUM 20 MG PO TABS
20.0000 mg | ORAL_TABLET | Freq: Every day | ORAL | Status: DC
  Administered 2015-02-27 – 2015-03-02 (×2): 20 mg via ORAL
  Filled 2015-02-26 (×7): qty 1

## 2015-02-26 MED ORDER — ASPIRIN EC 81 MG PO TBEC
81.0000 mg | DELAYED_RELEASE_TABLET | Freq: Every day | ORAL | Status: DC
  Administered 2015-02-27 – 2015-03-03 (×5): 81 mg via ORAL
  Filled 2015-02-26 (×6): qty 1

## 2015-02-26 MED ORDER — QUETIAPINE FUMARATE 50 MG PO TABS
50.0000 mg | ORAL_TABLET | Freq: Three times a day (TID) | ORAL | Status: DC
  Administered 2015-02-26 – 2015-03-03 (×14): 50 mg via ORAL
  Filled 2015-02-26 (×14): qty 1

## 2015-02-26 MED ORDER — ALBUTEROL SULFATE (2.5 MG/3ML) 0.083% IN NEBU
2.5000 mg | INHALATION_SOLUTION | RESPIRATORY_TRACT | Status: DC | PRN
  Filled 2015-02-26: qty 3

## 2015-02-26 MED ORDER — GABAPENTIN 300 MG PO CAPS
300.0000 mg | ORAL_CAPSULE | Freq: Three times a day (TID) | ORAL | Status: DC
  Administered 2015-02-26 – 2015-03-03 (×14): 300 mg via ORAL
  Filled 2015-02-26 (×14): qty 1

## 2015-02-26 MED ORDER — LEVOTHYROXINE SODIUM 25 MCG PO TABS
25.0000 ug | ORAL_TABLET | Freq: Every day | ORAL | Status: DC
  Administered 2015-02-27 – 2015-03-03 (×5): 25 ug via ORAL
  Filled 2015-02-26 (×7): qty 1

## 2015-02-26 MED ORDER — ACETAMINOPHEN 325 MG PO TABS
650.0000 mg | ORAL_TABLET | ORAL | Status: DC | PRN
  Filled 2015-02-26: qty 2

## 2015-02-26 MED ORDER — CYANOCOBALAMIN 500 MCG PO TABS
2500.0000 ug | ORAL_TABLET | Freq: Every day | ORAL | Status: DC
  Administered 2015-02-27 – 2015-03-03 (×4): 2500 ug via ORAL
  Filled 2015-02-26 (×6): qty 5

## 2015-02-26 MED ORDER — RIVAROXABAN 20 MG PO TABS
20.0000 mg | ORAL_TABLET | Freq: Every day | ORAL | Status: DC
  Administered 2015-02-27 – 2015-03-02 (×4): 20 mg via ORAL
  Filled 2015-02-26 (×5): qty 1

## 2015-02-26 MED ORDER — CLONAZEPAM 0.5 MG PO TABS
1.0000 mg | ORAL_TABLET | Freq: Once | ORAL | Status: AC
  Administered 2015-02-26: 1 mg via ORAL
  Filled 2015-02-26: qty 2

## 2015-02-26 MED ORDER — HALOPERIDOL 2 MG PO TABS
2.0000 mg | ORAL_TABLET | Freq: Three times a day (TID) | ORAL | Status: DC
  Administered 2015-02-26 – 2015-02-27 (×2): 2 mg via ORAL
  Filled 2015-02-26 (×2): qty 1

## 2015-02-26 MED ORDER — TEMAZEPAM 15 MG PO CAPS
30.0000 mg | ORAL_CAPSULE | Freq: Every day | ORAL | Status: DC
  Administered 2015-02-26 – 2015-03-02 (×5): 30 mg via ORAL
  Filled 2015-02-26 (×5): qty 2

## 2015-02-26 NOTE — ED Notes (Signed)
Pt will be holding here waiting on placement at [facility] or where theres is an available bed

## 2015-02-26 NOTE — ED Notes (Signed)
Pt is intermittently moaning, attempting to get out of bed, and moving around on the stretcher. Spoke with Dr. Dorris Carnes. [place] about patients condition and ordering additional medication for condition. At this time with patients blood pressure, no additional medication can be administered. Provided patient a sandwich and diet ginger ale. Also, pt is requesting ice cream. Eber Jones, RN has gone to get ice cream for patient.

## 2015-02-26 NOTE — ED Notes (Signed)
Pt changing clothes now

## 2015-02-26 NOTE — ED Notes (Signed)
Patient is becoming more agitated, anxious, moving around on stretcher, getting out of bed, ambulating to the nursing station, and increasing in moaning. At times, pt will ask a question in a clear voice and start back moaning.

## 2015-02-26 NOTE — ED Notes (Addendum)
Pt presents from [facility] group home. Has hx schizophrenia and paranoia. Signs/symptoms have been intermittent for a long time but recently worsened. All of medications were recently decreased and this exacerbated symptoms. Has been threatening to hit staff and reported suicidal ideations. Pt is unable to give report during triage. Continually yelling "when are we going home." Brother is at bedside along with group home Interior and spatial designer. Is coming from [facility] for medical clearance. Had labwork drawn at [facility] approximately 1.5 weeks ago along with an EKG for follow up after abx regimen completed for recurrent aspiration PNA. Caregiver also reports repeated falls.

## 2015-02-26 NOTE — ED Provider Notes (Signed)
CSN: [id number]     Arrival date & time 02/26/15  1707 History   First MD Initiated Contact with Patient 02/26/15 1719     Chief Complaint  Patient presents with  . Medical Clearance     level V caveat due to psychiatric disorder. (Consider location/radiation/quality/duration/timing/severity/associated sxs/prior Treatment) The history is provided by the patient and a relative.   patient presents for medical clearance and psychiatric placement. Has history of schizophrenia is been doing worse over the last couple weeks. Recent hospital admission for pneumonia and urinary tract infection. Has had some recent medication ingestions. Seen by psychiatry today and sent for placement. Patient cannot really participate in his history.  patient was reportedly seen at his primary care doctor last couple days and has been doing better. Labs at that time an x-ray that time reportedly improved.  Past Medical History  Diagnosis Date  . PE (pulmonary embolism)   . MR (mental retardation)   . Schizophrenia (HCC)   . Asthma    History reviewed. No pertinent past surgical history. Family History  Problem Relation Age of Onset  . Stroke Mother   . Prostate cancer Father   . Deep vein thrombosis Brother    Social History  Substance Use Topics  . Smoking status: Never Smoker   . Smokeless tobacco: None  . Alcohol Use: No    Review of Systems  Unable to perform ROS     Allergies  Trifluoperazine hcl and Tramadol  Home Medications   Prior to Admission medications   Medication Sig Start Date End Date Taking? Authorizing Provider  albuterol (PROVENTIL) (2.5 MG/3ML) 0.083% nebulizer solution Take 3 mLs (2.5 mg total) by nebulization every 4 (four) hours as needed for wheezing or shortness of breath. 02/17/15  Yes Shanker Levora Dredge, MD  antipyrine-benzocaine Lyla Son) otic solution Place 3-4 drops into the left ear every 4 (four) hours as needed for ear pain.   Yes Historical Provider, MD   aspirin EC 81 MG tablet Take 81 mg by mouth daily.   Yes Historical Provider, MD  benztropine (COGENTIN) 1 MG tablet Take 0.5 mg by mouth 2 (two) times daily.   Yes Historical Provider, MD  betamethasone dipropionate (DIPROLENE) 0.05 % cream Apply 1 application topically daily.   Yes Historical Provider, MD  doxycycline (VIBRA-TABS) 100 MG tablet Take 1 tablet (100 mg total) by mouth 2 (two) times daily. FOR 7 MORE DAYS FROM 9/25 AND THEN STOP 02/17/15  Yes Shanker Levora Dredge, MD  gabapentin (NEURONTIN) 300 MG capsule Take 300 mg by mouth 3 (three) times daily.   Yes Historical Provider, MD  haloperidol (HALDOL) 2 MG tablet Take 2 mg by mouth 3 (three) times daily.   Yes Historical Provider, MD  levothyroxine (SYNTHROID, LEVOTHROID) 25 MCG tablet Take 25 mcg by mouth daily before breakfast.   Yes Historical Provider, MD  Methylcobalamin 2500 MCG SUBL Place 1 tablet under the tongue daily.   Yes Historical Provider, MD  phenol (CHLORASEPTIC) 1.4 % LIQD Use as directed 2 sprays in the mouth or throat every 6 (six) hours as needed for throat irritation / pain.   Yes Historical Provider, MD  pravastatin (PRAVACHOL) 20 MG tablet Take 20 mg by mouth at bedtime.   Yes Historical Provider, MD  Pseudoeph-Doxylamine-DM-APAP (NYQUIL PO) Take 10 mLs by mouth at bedtime as needed (for sore throat).   Yes Historical Provider, MD  QUEtiapine (SEROQUEL) 50 MG tablet Take 50 mg by mouth 3 (three) times daily.   Yes  Historical Provider, MD  rivaroxaban (XARELTO) 20 MG TABS tablet Take 20 mg by mouth daily with supper. 02/14/15  Yes Historical Provider, MD  sodium chloride (OCEAN) 0.65 % SOLN nasal spray Place 2 sprays into both nostrils as needed for congestion.   Yes Historical Provider, MD  temazepam (RESTORIL) 30 MG capsule Take 30 mg by mouth at bedtime.   Yes Historical Provider, MD  dextromethorphan-guaiFENesin (MUCINEX DM) 30-600 MG per 12 hr tablet Take 1 tablet by mouth 2 (two) times daily. Patient not taking:  Reported on 02/26/2015 02/17/15   Maretta Bees, MD   BP 99/50 mmHg  Pulse 97  Temp(Src) 98.4 F (36.9 C) (Oral)  Resp 19  SpO2 96% Physical Exam  Constitutional: He appears well-developed and well-nourished.  HENT:  Head: Atraumatic.  Cardiovascular:  Tachycardia  Pulmonary/Chest: Effort normal.  Abdominal: Soft.  Musculoskeletal:  Unable to do good muscle skeletal exam due to patient's agitation.  Neurological:  Patient is awake, rather agitated. Pacing the room. Will follow some commands. States he is thirsty.  Skin: There is pallor.    ED Course  Procedures (including critical care time) Labs Review Labs Reviewed  COMPREHENSIVE METABOLIC PANEL - Abnormal; Notable for the following:    Sodium 134 (*)    Glucose, Bld 130 (*)    Calcium 8.6 (*)    Albumin 3.3 (*)    ALT 11 (*)    All other components within normal limits  URINE RAPID DRUG SCREEN, HOSP PERFORMED - Abnormal; Notable for the following:    Benzodiazepines POSITIVE (*)    All other components within normal limits  CBC WITH DIFFERENTIAL/PLATELET - Abnormal; Notable for the following:    RBC 3.68 (*)    Hemoglobin 10.9 (*)    HCT 34.5 (*)    RDW 15.8 (*)    Monocytes Absolute 1.1 (*)    All other components within normal limits  URINALYSIS, ROUTINE W REFLEX MICROSCOPIC (NOT AT [facility])  ETHANOL    Imaging Review Dg Chest Portable 1 View  02/26/2015   ADDENDUM REPORT: 02/26/2015 19:23 ADDENDUM: Comparison is now made with previous CT chest angio performed 02/14/2015 and numerous prior chest x-rays performed 01/31/2015 and earlier (performed under a different MRN). The pleural changes on the right have been stable. The questioned changes at the left lung base are likely also stable. The salient findings were discussed with Benjiman Core on 02/26/2015 at 7:23 pm. Electronically Signed   By: Norva Pavlov M.D.   On: 02/26/2015 19:23  02/26/2015   CLINICAL DATA:  Weakness.  Confusion.  Altered mental  status today.  EXAM: PORTABLE CHEST 1 VIEW  COMPARISON:  None.  FINDINGS: 2 frontal views. Both degraded by patient body habitus and AP portable technique. Midline trachea. Cardiomegaly accentuated by AP portable technique. No pneumothorax. No left-sided pleural fluid. Pleural-parenchymal opacity at the inferior right hemi thorax. Right hemidiaphragm elevation and probable eventration laterally. No gross congestive failure. Possible patchy left base airspace disease.  IMPRESSION: Decreased sensitivity and specificity exam due to technique related factors, as described above.  Pleural-parenchymal opacity in the inferior right hemi thorax. of indeterminate acuity. At least partially felt to be chronic. Small volume right-sided pleural fluid and adjacent airspace disease cannot be excluded. Comparison with priors would be informative. Also consider PA and lateral radiographs of possible.  Cannot exclude concurrent left base airspace disease.  Electronically Signed: By: Jeronimo Greaves M.D. On: 02/26/2015 18:23   I have personally reviewed and evaluated these images  and lab results as part of my medical decision-making.   EKG Interpretation None      MDM   Final diagnoses:  Schizophrenia, unspecified type [facility])    Patient with altered mental status. Discuss with patient family member. This is typical of his psychiatric problems. Appears to medically cleared at this time. Chest x-rays reassuring. Urine does not show infection. Slightly lower blood pressure but does not appear symptomatic. To be seen by TTS.    Benjiman Core, MD 02/27/15 [phone]

## 2015-02-26 NOTE — BHH Counselor (Signed)
02/26/15 Referral packet sent to [place], [address], [place], [place], [place], [place]. [place], [place], and [place]. Maureen Delatte K. Starla Deller, NCC, LPC-A, LCAS-A  Counselor 02/26/2015 10:45 PM
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
## 2015-02-27 DIAGNOSIS — F209 Schizophrenia, unspecified: Secondary | ICD-10-CM | POA: Diagnosis not present

## 2015-02-27 MED ORDER — CLONAZEPAM 0.5 MG PO TABS
0.5000 mg | ORAL_TABLET | Freq: Two times a day (BID) | ORAL | Status: DC
  Administered 2015-02-27 – 2015-03-03 (×9): 0.5 mg via ORAL
  Filled 2015-02-27 (×9): qty 1

## 2015-02-27 MED ORDER — HALOPERIDOL 5 MG PO TABS
10.0000 mg | ORAL_TABLET | Freq: Every day | ORAL | Status: DC
  Administered 2015-02-27 – 2015-03-01 (×3): 10 mg via ORAL
  Filled 2015-02-27 (×3): qty 2

## 2015-02-27 MED ORDER — HALOPERIDOL 2 MG PO TABS
4.0000 mg | ORAL_TABLET | Freq: Two times a day (BID) | ORAL | Status: DC
  Administered 2015-02-27 – 2015-03-02 (×7): 4 mg via ORAL
  Filled 2015-02-27 (×7): qty 2

## 2015-02-27 NOTE — Progress Notes (Signed)
Confirmed with pcp office listed on pt medicaid card that Dr Royanne Foots is confirmed pcp  EPIC updated

## 2015-02-27 NOTE — ED Notes (Signed)
Pt continues to be agitated out in hallway, moaning and mumbles continuously.

## 2015-02-27 NOTE — ED Notes (Signed)
MD at bedside. 
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
## 2015-02-27 NOTE — ED Notes (Signed)
Attempted to take pt's VS, but the pt took the bp cuff off his arm stating that it was too tight.
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
## 2015-02-27 NOTE — ED Notes (Signed)
Pt in hallway agitated and arguing with security.  Pt walked back to room, pt moaning loudly continuous.

## 2015-02-28 DIAGNOSIS — F209 Schizophrenia, unspecified: Secondary | ICD-10-CM | POA: Diagnosis not present

## 2015-02-28 MED ORDER — LORAZEPAM 2 MG/ML IJ SOLN
2.0000 mg | Freq: Once | INTRAMUSCULAR | Status: AC
  Administered 2015-02-28: 2 mg via INTRAMUSCULAR
  Filled 2015-02-28: qty 1

## 2015-02-28 NOTE — Consult Note (Signed)
  Psychiatric Specialty Exam: Physical Exam  ROS  Blood pressure 107/53, pulse 69, temperature 98.3 F (36.8 C), temperature source Oral, resp. rate 18, SpO2 98 %.There is no weight on file to calculate BMI.  General Appearance: Casual  Eye Contact:: Fair  Speech: Clear and Coherent  Volume: Normal  Mood: Irritable  Affect: Congruent and Labile  Thought Process: Coherent  Orientation: Other: to self and place  Thought Content: WDL  Suicidal Thoughts: No  Homicidal Thoughts: No  Memory: Immediate; Fair Recent; Fair Remote; Fair  Judgement: Fair  Insight: Shallow  Psychomotor Activity: Psychomotor Retardation  Concentration: Fair  Recall: NA  Fund of Knowledge:Fair  Language: Good  Akathisia: NA  Handed: Right  AIMS (if indicated):    Assets: Desire for Improvement  ADL's: Impaired  Cognition: Impaired, Moderate           Patient has remained calm and cooperative.  He is eating and drinking without difficulty, He slept well last night.  Patient spends time in his room and ambulates to the bathroom with out difficulty.  Patient denies SI/HI/AVH.  He is waiting for placement as his group home does not want him back because he need higher level of care.  SW is involved in seeking placement for patient.  We will continue to monitor patient and offer his medications.  Schizophrenia (HCC)   Plan:  Seek placement at a higher level of care placement.  SW  Aware of need for placement  Dahlia Byes   PMHNP-BC

## 2015-02-28 NOTE — Progress Notes (Addendum)
ED CM spoke with ED SW about home services for pt  Pt evaluated by PT during 02/14/15 [facility] hospitalizations  Follow Up Recommendations No PT follow up;Supervision - Intermittent Equipment Recommendations None recommended by PT No orders for home services at discharge on  02/17/15   ED CM noted pt walking around the nursing unit during a 20 minute time frame without DME or any assistance in TCU on 02/27/15 during a period of agitation. Pt followed instructions of TCU staff when it was time for his medication Pt did not want TCU staff to touch him during mobility (walking unit or getting into bed) Pt fed himself Cm recommendation: No home PT/OT needed for d/c Pt not meeting criteria related to independence in mobility and simple ADLs

## 2015-02-28 NOTE — Progress Notes (Signed)
CSW spoke further iwht [name] regarding disposition. Pt group home is concerned due to patient not having 24 hour supervision and at times patient does not have supervision. CSW discussing with psychiatric team staff regarding supervision needs at this time. Per Marjean Donna, patient is not supervised constantly throughout the day, and expectation is to have 2-3 hours unsupervised time daily, and expected to be able to remain safe while staff sleep.   Olga Coaster, LCSW  Clinical Social Work  [employer] [phone]

## 2015-02-28 NOTE — ED Notes (Addendum)
Pt keeps trying to leave TCU; pt keeps voicing he wants to go home. Pt tried to go into another patient's room when the nurse was speaking with another patient. Pt moaning and will not lower voice when asked. Pt upsetting other patients.

## 2015-02-28 NOTE — Progress Notes (Addendum)
Per discussion with pt RN, on 02/27/2015, patient became verbally agitated but was able to redirect himself to his room. At that time, pt was given hi PO medications that are scheduled-with increased dosage of haldol and klonopin added per psychiatrist.   CSW called patient brother/guardian at [phone] to discuss patient and left message.   Olga Coaster, LCSW  Clinical Social Work  [employer] [phone]

## 2015-02-28 NOTE — Progress Notes (Signed)
CSW updated pt guardian, Albert Donovan [phone] on patient pending disharge back to group home. Pt guardian in agreement and verbalized understanding. Per guardian he does not have another contact number for Albert Donovan or the gorup home. CSW has called pt group home supervisor, [name] at [phone] and left messages 4x. CSW called patient care coordinator, Kennyth Arnold [phone] 2x this afternoon and left messages.   Olga Coaster, LCSW  Clinical Social Work  [employer] [phone]

## 2015-02-28 NOTE — Progress Notes (Signed)
Per psychiatrist, patient is psychiatrically stable for discharge back to group home. Per psychiatrist pt is tolerating medication adjustments well. Per conversation with nurse and chart review patient has been redirectable and taking po medications. Pt did have trouble falling asleep last night but was redirectable. Pt fell asleep late and therefore slept in this morning. However pt did wake up for his morning medications, walked to the bathroom on his own, and is stable for discharge.   CSW updated patient brother/guardian, Anthonny Schiller who is in agrement with plan and verablized understanding. CSW also discussed with pt guardian the need for patient to get a psychological assessment completed to identify patient IQ level so that he could hopefully receive more resources and an I/DD care cooridnator.    CSW left message for patient care coordinator Kennyth Arnold [phone]- to discuss plan. CSW and pt care coordinator have already discussed the need for psychological. CSW provided patient care cooridnator with suggestion of [facility] to assist with psycholgoical assessment.   CSW called pt group home supervisor, [name] [phone] regarding pt disposition.    Olga Coaster, LCSW  Clinical Social Work  [employer] [phone]

## 2015-03-01 DIAGNOSIS — F209 Schizophrenia, unspecified: Secondary | ICD-10-CM | POA: Diagnosis not present

## 2015-03-01 DIAGNOSIS — F203 Undifferentiated schizophrenia: Secondary | ICD-10-CM | POA: Diagnosis not present

## 2015-03-01 MED ORDER — LORAZEPAM 2 MG/ML IJ SOLN
2.0000 mg | Freq: Once | INTRAMUSCULAR | Status: AC
  Administered 2015-03-01: 2 mg via INTRAMUSCULAR
  Filled 2015-03-01: qty 1

## 2015-03-01 NOTE — ED Notes (Signed)
Pt OOB again, security and staff redirected patient from TCU doors back to bed. Pt refusing to stay in room and becoming non-compliant with staff. Pt escalating and angry with staff for directing him back to his room. Will obtain an order for Ativan. Pt last dosed at 1830 10/6.

## 2015-03-01 NOTE — ED Notes (Addendum)
Pt's brother at bedside and wishes to speak to [facility] provider. MH provider notified but unable to see brother until 2pm. Brother left number to be notified of updates. Dwain Sarna [phone]

## 2015-03-01 NOTE — ED Notes (Signed)
Pt alert to self, restless, Sitter at the bedside, no c/o pain will continue to monitor and redirect. Estill Dooms, RN 7:53 PM 03/01/2015
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
## 2015-03-01 NOTE — ED Notes (Signed)
Meal offered to patient, am meds given, pt refused meal and states he wants to go back to sleep. Cooperative and calm.

## 2015-03-01 NOTE — Progress Notes (Signed)
Per chart review, pt became agitated wanting to go home. Pt was given IM ativan. CSW discussed with group home owner. Injections are not available in group home setting. Pt to be seen by psychiatrist and NP.   Olga Coaster, LCSW  Clinical Social Work  [facility] [phone]

## 2015-03-01 NOTE — Progress Notes (Addendum)
Pt mumbles and can be loud at times. He stated he likes being on a stretcher and does not want to be moved to a hospital bed. Pt requested a sandwich and was instructed to wait a few minutes to make sure he does not vomit. 5:50p-Pt just wet the bed and then stated I can go to the bathroom.

## 2015-03-01 NOTE — ED Notes (Signed)
Pt escalating, continues to moan and becoming non-compliabnt. Pt OOB and attempting to leave the TCU. Security present and attempting to get pt back to bed. Pt relocated to room.

## 2015-03-01 NOTE — ED Notes (Signed)
Pt with large amount of emesis, undigested food. Pt up to shower. States feeling better after vomiting x 1.

## 2015-03-02 DIAGNOSIS — F209 Schizophrenia, unspecified: Secondary | ICD-10-CM | POA: Diagnosis not present

## 2015-03-02 MED ORDER — HALOPERIDOL 5 MG PO TABS
5.0000 mg | ORAL_TABLET | Freq: Every day | ORAL | Status: DC
  Administered 2015-03-02: 5 mg via ORAL
  Filled 2015-03-02: qty 1

## 2015-03-02 MED ORDER — HALOPERIDOL 2 MG PO TABS
2.0000 mg | ORAL_TABLET | Freq: Two times a day (BID) | ORAL | Status: DC
  Administered 2015-03-03: 2 mg via ORAL
  Filled 2015-03-02: qty 1
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
## 2015-03-02 NOTE — ED Notes (Addendum)
Pt is asleep. He remains a 1:1 and remains safe. 8:30-pt took all his am meds but did not want to eat breakfast . He requested to let him sleep for now. Pt stated he did not want to be placed in a hospital bed but likes the stretcher. Pt woke up to have lunch and then asked to go back to bed. He appears calm this am and afternoon and is sleepy. Pt is not as loud and vocal as he has been on previous days. 2pm-He remains cooperative and remains a 1:1 for safety.5p-Phoned NP regarding low BP. Will continue to push fluids. Pt denies feeling lightheaded or dizzy. He is eating graham crackers and drinking gatorade. 5:30pm-Pt stated he was tired and wanted to sleep some more. 5:40p-pt keeps repeating ,"I want to watch the [facility], I want to watch the [employer] show." Pt keeps repeating,"does this make you sick, Does this make you sick?"

## 2015-03-03 DIAGNOSIS — F209 Schizophrenia, unspecified: Secondary | ICD-10-CM | POA: Diagnosis not present

## 2015-03-03 MED ORDER — HALOPERIDOL 2 MG PO TABS: 2.0000 mg | ORAL_TABLET | Freq: Two times a day (BID) | ORAL | Status: AC

## 2015-03-03 MED ORDER — CLONAZEPAM 0.5 MG PO TABS: 0.5000 mg | ORAL_TABLET | Freq: Two times a day (BID) | ORAL | Status: AC

## 2015-03-03 MED ORDER — OLANZAPINE 10 MG PO TBDP: 10.0000 mg | ORAL_TABLET | Freq: Three times a day (TID) | ORAL | Status: AC | PRN

## 2015-03-03 MED ORDER — OLANZAPINE 10 MG PO TBDP: 10.0000 mg | ORAL_TABLET | Freq: Three times a day (TID) | ORAL | Status: DC | PRN

## 2015-03-03 MED ORDER — HALOPERIDOL 5 MG PO TABS: 5.0000 mg | ORAL_TABLET | Freq: Every day | ORAL | Status: AC

## 2015-03-03 NOTE — Progress Notes (Signed)
10:14am. CSW received return call from [place], group home supervisor. CSW provided overview of nursing notes and report from past 24 hours, and that pt has not been aggressive in past 24 hrs and not required an IM injection. RN also spoke with Marjean Donna and reviewed [facility] and behavior over past few days. Marjean Donna requested that psych provide scripts for any changes in meds--psych team aware and will provide. Marjean Donna expressed concerns over transportation.  CSW also spoke with pt's guardian and brother, Albert Donovan [phone]). Albert Donovan expressed concern about pt and that "he is going through a revolving door and not getting what he needs." CSW provided supportive counseling and answered guardian's questions about the MH/DD system. CSW also re-enforced what weekday staff has advised-that pt requires a psychological. CSW mentioned [facility] as one option. Albert Donovan thanked CSW for her time.   CSW also placed call and left message for pt's care coordinator, Kennyth Arnold [phone], updating on disposition and need for linkage to additional IDD resources.    York Spaniel [facility] Clinical Social Worker [facility] phone: [phone]
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
## 2015-03-03 NOTE — Progress Notes (Deleted)
Reassessment:   Patient denies SI/HI, A/V hallucinations, and other self-injurious behaviors. Patient reports he want to return back to his place of residence. "I want to go back to [facility], room 52 is my room".  Patient reports compliant with medications, good eating, social skill, and hygiene.      Maryelizabeth Rowan, MSW, [facility] Triage Specialist [phone]

## 2015-03-03 NOTE — Progress Notes (Addendum)
CSW left message at 8:15am and 9:40am  for Marjean Donna, group home supervisor, to provide update on pt's behavior in past 24 hours. Left messages.  York Spaniel [facility] Clinical Social Worker [facility] phone: [phone]

## 2015-03-03 NOTE — [facility] Suicide Risk Assessment (Signed)
Suicide Risk Assessment  Discharge Assessment   [facility] Discharge Suicide Risk Assessment   Demographic Factors:  Male and Caucasian  Total Time spent with patient: 30 minutes  Musculoskeletal: Strength & Muscle Tone: within normal limits Gait & Station: normal Patient leans: N/A  Psychiatric Specialty Exam: Review of Systems  Psychiatric/Behavioral: Positive for depression. Negative for substance abuse. The patient is nervous/anxious.   All other systems reviewed and are negative.   Blood pressure 93/65, pulse 72, temperature 98.1 F (36.7 C), temperature source Oral, resp. rate 18, SpO2 95 %.There is no weight on file to calculate BMI.  General Appearance: Casual  Eye Contact::  Good  Speech:  Clear and Coherent  Volume:  Normal  Mood:  Euthymic  Affect:  Congruent   Thought Process:  Coherent  Orientation:  Alert and oriented x 3  Thought Content:  WDL  Suicidal Thoughts:  No  Homicidal Thoughts:  No  Memory:  Immediate;   Fair Recent;   Fair Remote;   Fair  Judgement:  Good  Insight:  Fair  Psychomotor Activity:  Normal  Concentration:  Good  Recall:  NA  Fund of Knowledge:Fair  Language: Good  Akathisia:  NA  Handed:  Right  AIMS (if indicated):     Assets:  Desire for Improvement  ADL's:  Impaired  Cognition: Impaired,  Moderate  Sleep:         Has this patient used any form of tobacco in the last 30 days? (Cigarettes, Smokeless Tobacco, Cigars, and/or Pipes) Yes, A prescription for an FDA-approved tobacco cessation medication was offered at discharge and the patient refused  Mental Status Per Nursing Assessment::   On Admission:   Agitation  Current Mental Status by Physician: NA  Loss Factors: NA  Historical Factors: NA  Risk Reduction Factors:   Sense of responsibility to family, Living with another person, especially a relative, Positive social support and Positive therapeutic relationship  Continued Clinical Symptoms:  None  Cognitive  Features That Contribute To Risk:  None    Suicide Risk:  Minimal: No identifiable suicidal ideation.  Patients presenting with no risk factors but with morbid ruminations; may be classified as minimal risk based on the severity of the depressive symptoms  Principal Problem: Schizophrenia [facility]) Discharge Diagnoses:  Patient Active Problem List   Diagnosis Date Noted  . Schizophrenia (HCC) [F20.9]     Priority: High  . AKI (acute kidney injury) (HCC) [N17.9] 02/15/2015  . Asthma, mild intermittent [J45.20]   . HCAP (healthcare-associated pneumonia) [J18.9]   . Dysarthria [R47.1]   . Weakness [R53.1] 02/14/2015  . Slurred speech [R47.81] 02/14/2015  . Aspiration pneumonia (HCC) [J69.0] 02/14/2015  . Abdominal pain [R10.9] 02/14/2015  . PE (pulmonary embolism) [I26.99]   . MR (mental retardation) [F79]   . Asthma [J45.909]     Follow-up Information    Go to Dan Maker, MD.   Specialty:  Internal Medicine   Why:  This has been confirmed as your medicaid [place] access pcp.  If you prefer to see another pcp please contact the DSS    Contact information:   [address] [place] [address] [phone]       Plan Of Care/Follow-up recommendations:  Activity:  as tolerated Diet:  heart healthy diet  Is patient on multiple antipsychotic therapies at discharge:  No   Has Patient had three or more failed trials of antipsychotic monotherapy by history:  No  Recommended Plan for Multiple Antipsychotic Therapies: NA  Nanine Means, PMH-NP 03/03/2015, 10:38 AM

## 2015-03-03 NOTE — ED Notes (Signed)
Pt to d/c home prior to 1pm today when ride comes and gets patient

## 2015-03-03 NOTE — ED Notes (Addendum)
Pt is asleep on his right side. He appears comfortable and respirations are regular. 8:30a-Pt took his medications this am. He is very irritable and stated,'close that door and let me sleep some more. " Pt refused to eat his breakfast stating,"it is to early to get up." Pt was covered with a blanket and said,'thank you."Informed pt in a short while he would need to shower. Pt stated,"let me sleep."Pt  does have body odor. 10am- spoke with the group home director, Marjean Donna concerning pts medication regime .Informed Marjean Donna that the pt has not exhibited any aggressive behavior while in my care. Pt is very irritable when ever spoken to. Pt has gotten up to use the bathroom and was steady on his feet. Will continue to monitor closely. Pts brother phoned inquiring if the pt was going to a long term care facility. Referred the brother, Deniece Portela, to Esto, the Child psychotherapist to address his concerns. The brother left his contact number of: [phone]. Pt has not had any auditory or visual hallucinations .Per Marjean Donna she stated,"we only have one male staff member for 6 pts so we need to make sure he is not violent." Marjean Donna was reassured the pt has not exhibited any aggressive behavior- Pt s/p a shower and tolerated well. He did decide to eat breakfast. (11am)

## 2015-07-05 DIAGNOSIS — R531 Weakness: Secondary | ICD-10-CM | POA: Insufficient documentation

## 2015-07-05 DIAGNOSIS — R4701 Aphasia: Secondary | ICD-10-CM | POA: Insufficient documentation

## 2017-05-08 IMAGING — CT CT ANGIO CHEST
2 of 7 series · 17 of 46 positions shown · IV contrast (APPLIED)
Comparison: None.

CLINICAL DATA: Abdominal pain radiating into the back.

EXAM:
CT ANGIOGRAPHY CHEST
CT ABDOMEN AND PELVIS WITH CONTRAST
TECHNIQUE: Multidetector CT imaging of the chest was performed using the
standard protocol during bolus administration of intravenous
contrast. Multiplanar CT image reconstructions and MIPs were
obtained to evaluate the vascular anatomy. Multidetector CT imaging
of the abdomen and pelvis was performed using the standard protocol
during bolus administration of intravenous contrast.
CONTRAST:  100mL OMNIPAQUE IOHEXOL 350 MG/ML SOLN

[Series 6: thins · axial · 0.83mm/px · z∈[-18,+254]mm · 15 of 305 slices shown]
[im 17/305  lung]
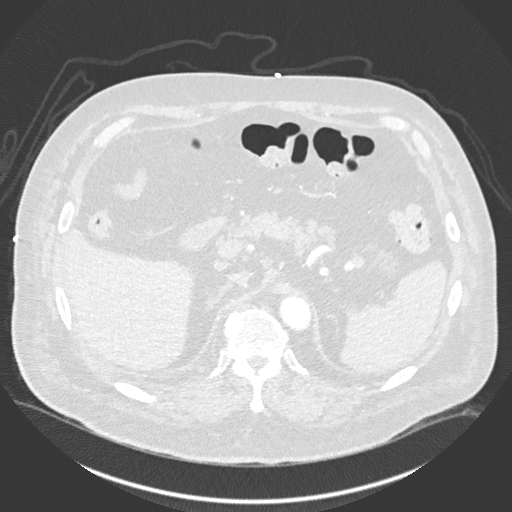
[im 33/305  soft-tissue]
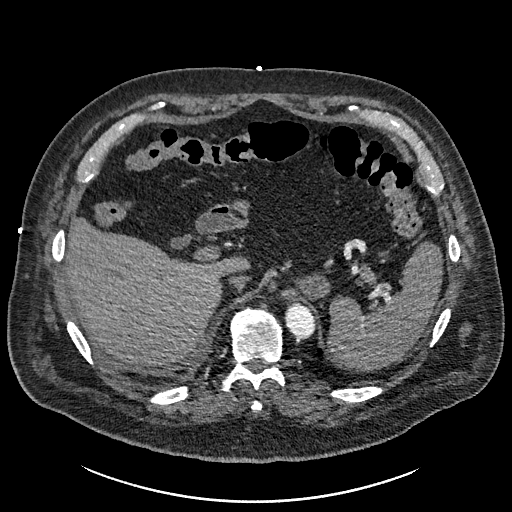
[im 65/305  lung]
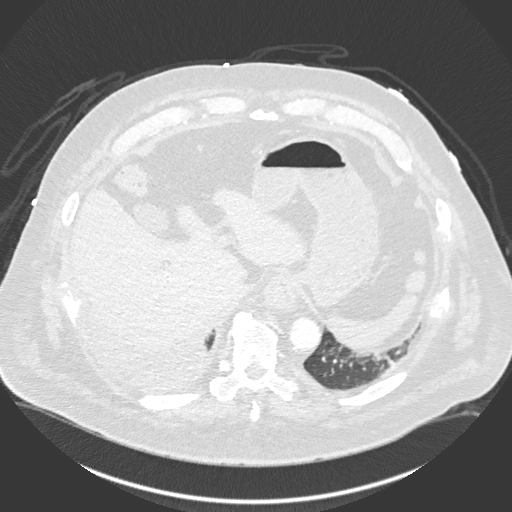
[im 81/305  soft-tissue]
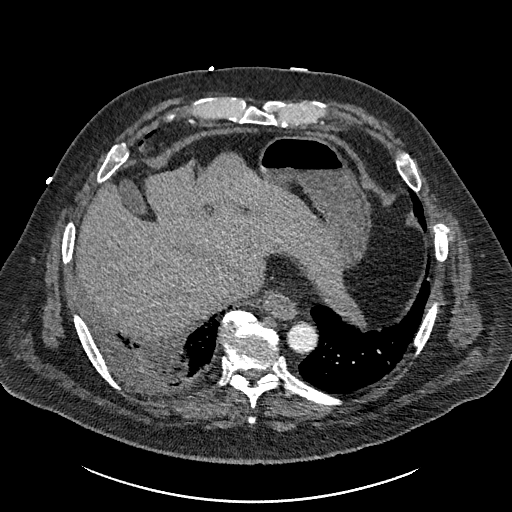
[im 97/305  lung]
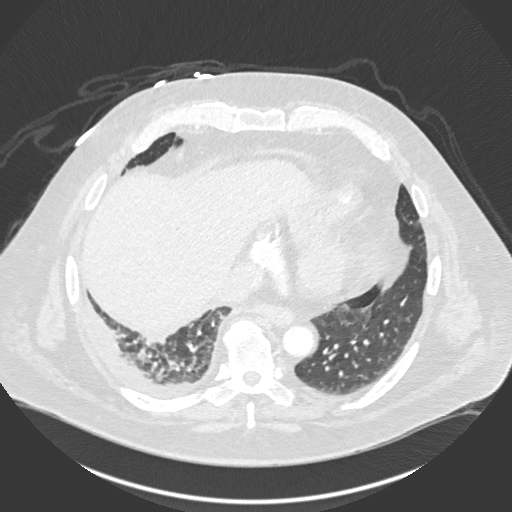
[im 113/305  soft-tissue]
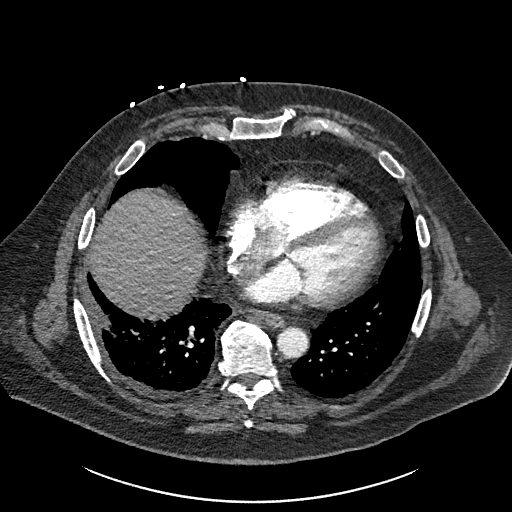
[im 129/305  lung]
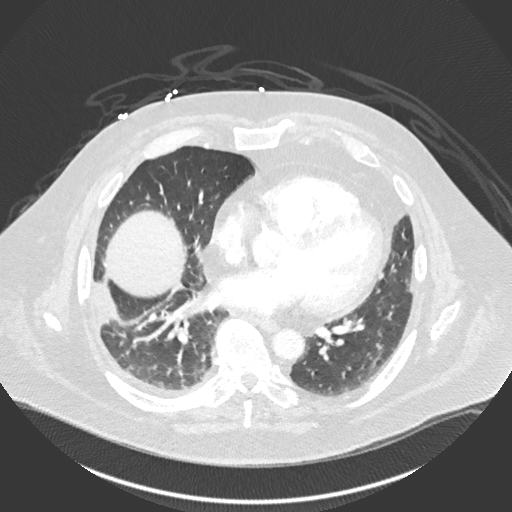
[im 161/305  soft-tissue]
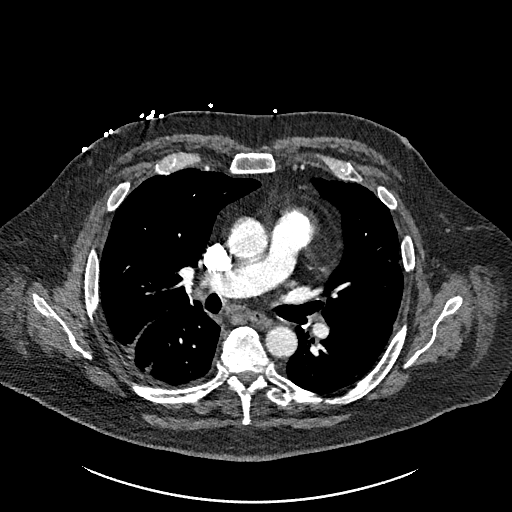
[im 177/305  lung]
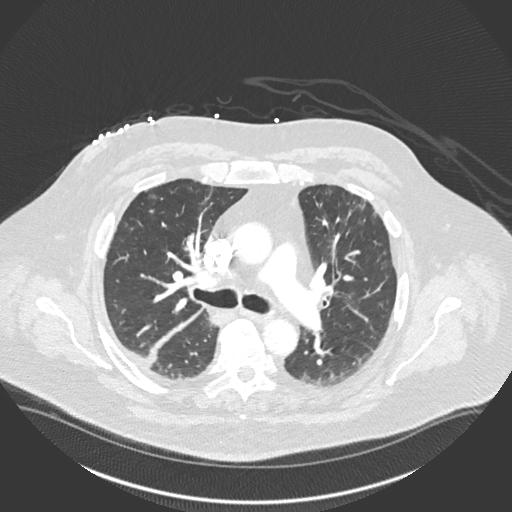
[im 193/305  soft-tissue]
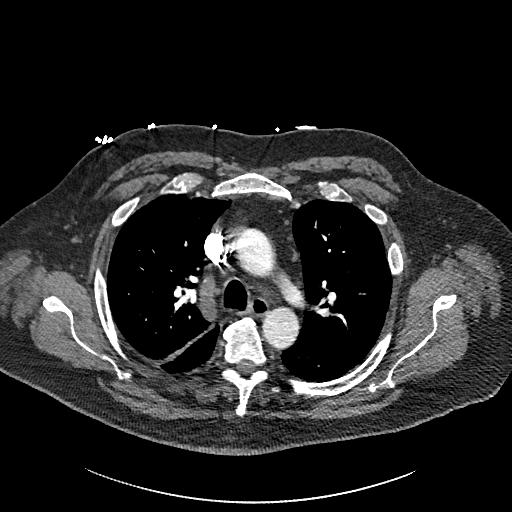
[im 209/305  lung]
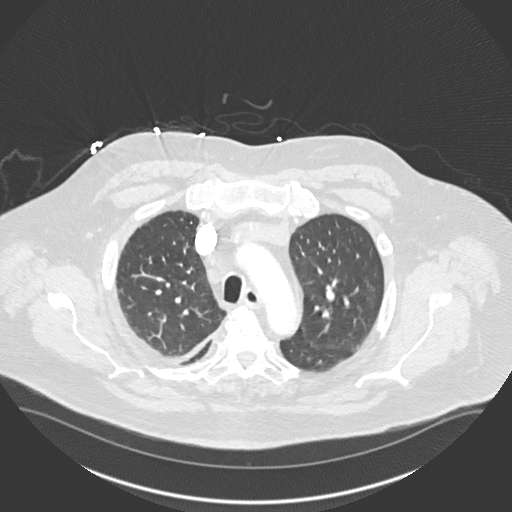
[im 225/305  soft-tissue]
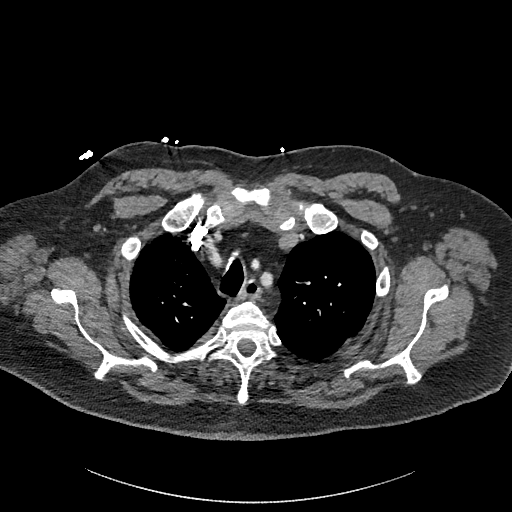
[im 257/305  lung]
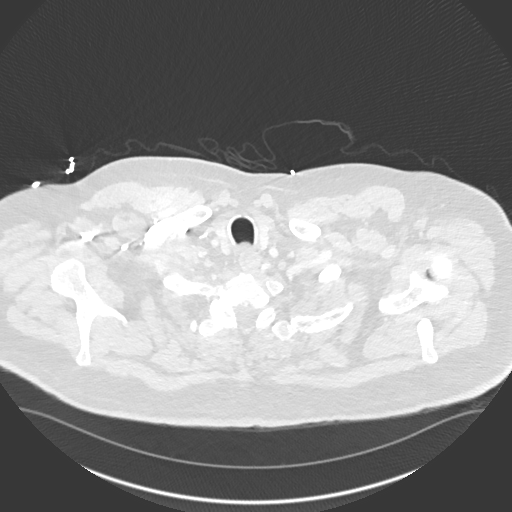
[im 273/305  soft-tissue]
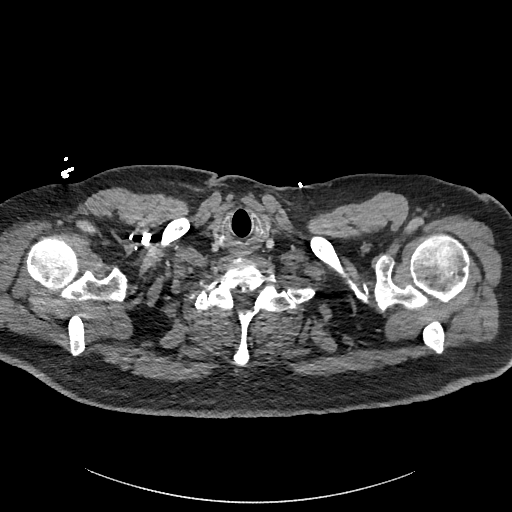
[im 289/305  lung]
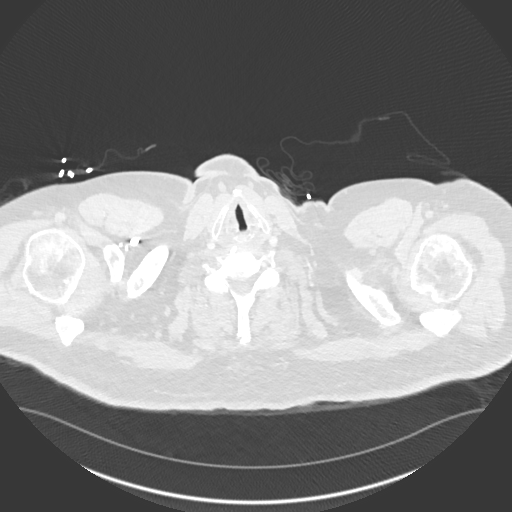

[Series 8: coronal mpr · coronal · 0.52mm/px · 2 of 104 slices shown]
[im 35/104  soft-tissue]
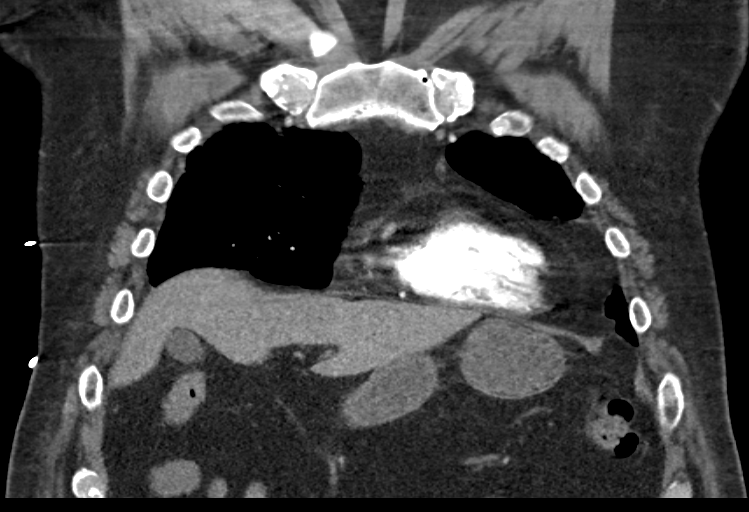
[im 69/104  soft-tissue]
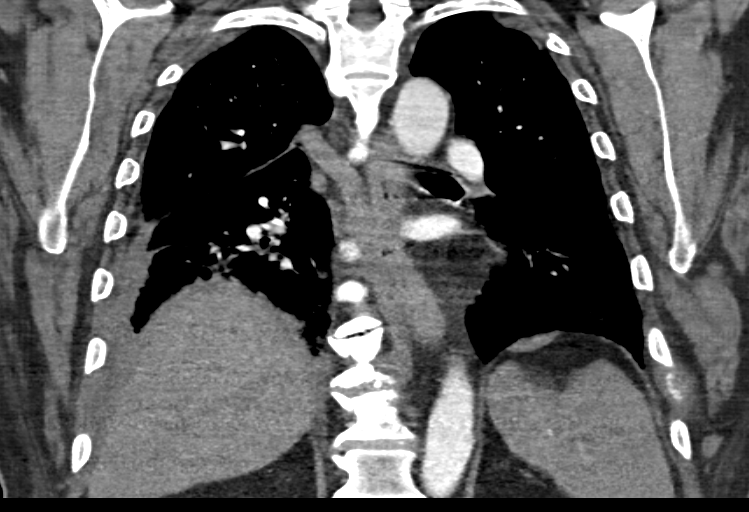

[17 of 46 positions shown; findings below may reference images not displayed]

FINDINGS: CTA CHEST FINDINGS

Technically adequate study with moderately good opacification of the
central and proximal segmental pulmonary arteries. No focal filling
defects demonstrated. No evidence of significant pulmonary embolus.

Normal heart size. Normal caliber thoracic aorta. No evidence of
aortic dissection. Mild calcification in the aorta. Great vessel
origins are patent. Mediastinal lymph nodes are not pathologically
enlarged. Esophagus is decompressed. Small esophageal hiatal hernia.

Small right pleural effusion with basilar infiltration and volume
loss, possibly pneumonia. Mild dependent changes in the left lung
base. No pneumothorax.

CT ABDOMEN and PELVIS FINDINGS

The liver, spleen, gallbladder, pancreas, adrenal glands, abdominal
aorta, inferior vena cava, and retroperitoneal lymph nodes are
unremarkable. Sub cm low-attenuation lesion in the right kidney
probably represents a cyst although too small to characterize. 2 mm
stone in the lower pole left kidney. No hydronephrosis or
hydroureter. Stomach, small bowel, and colon are not abnormally
distended. No free air or free fluid in the abdomen.

Pelvis: Appendix is not identified. Bladder wall is diffusely
thickened possibly suggesting cystitis. Prostate gland is not
enlarged. No free or loculated pelvic fluid collections. No pelvic
mass or lymphadenopathy. No evidence of diverticulitis. Degenerative
changes in the spine. No destructive bone lesions.

Review of the MIP images confirms the above findings.
IMPRESSION: No evidence of significant pulmonary embolus. No evidence of aortic
dissection. Consolidation in the right lung base with small pleural
effusion, possibly pneumonia.

Nonobstructing stone in the left kidney. Diffuse bladder wall
thickening suggesting cystitis. No evidence of bowel obstruction or
inflammation.

## 2017-05-20 IMAGING — DX DG CHEST 1V PORT
1 series · 2 of 2 positions shown · non-contrast
Comparison: None.

ADDENDUM:
Comparison is now made with previous CT chest angio performed
02/14/2015 and numerous prior chest x-rays performed 01/31/2015 and
earlier (performed under a different MRN). The pleural changes on
the right have been stable. The questioned changes at the left lung
base are likely also stable.

The salient findings were discussed with HAIIFA ES on
02/26/2015 at [DATE].
CLINICAL DATA: Weakness.  Confusion.  Altered mental status today.
EXAM:
PORTABLE CHEST 1 VIEW

[Series 1: chest ap · 0.14mm/px · 2 of 2 slices shown]
[im 1/2]
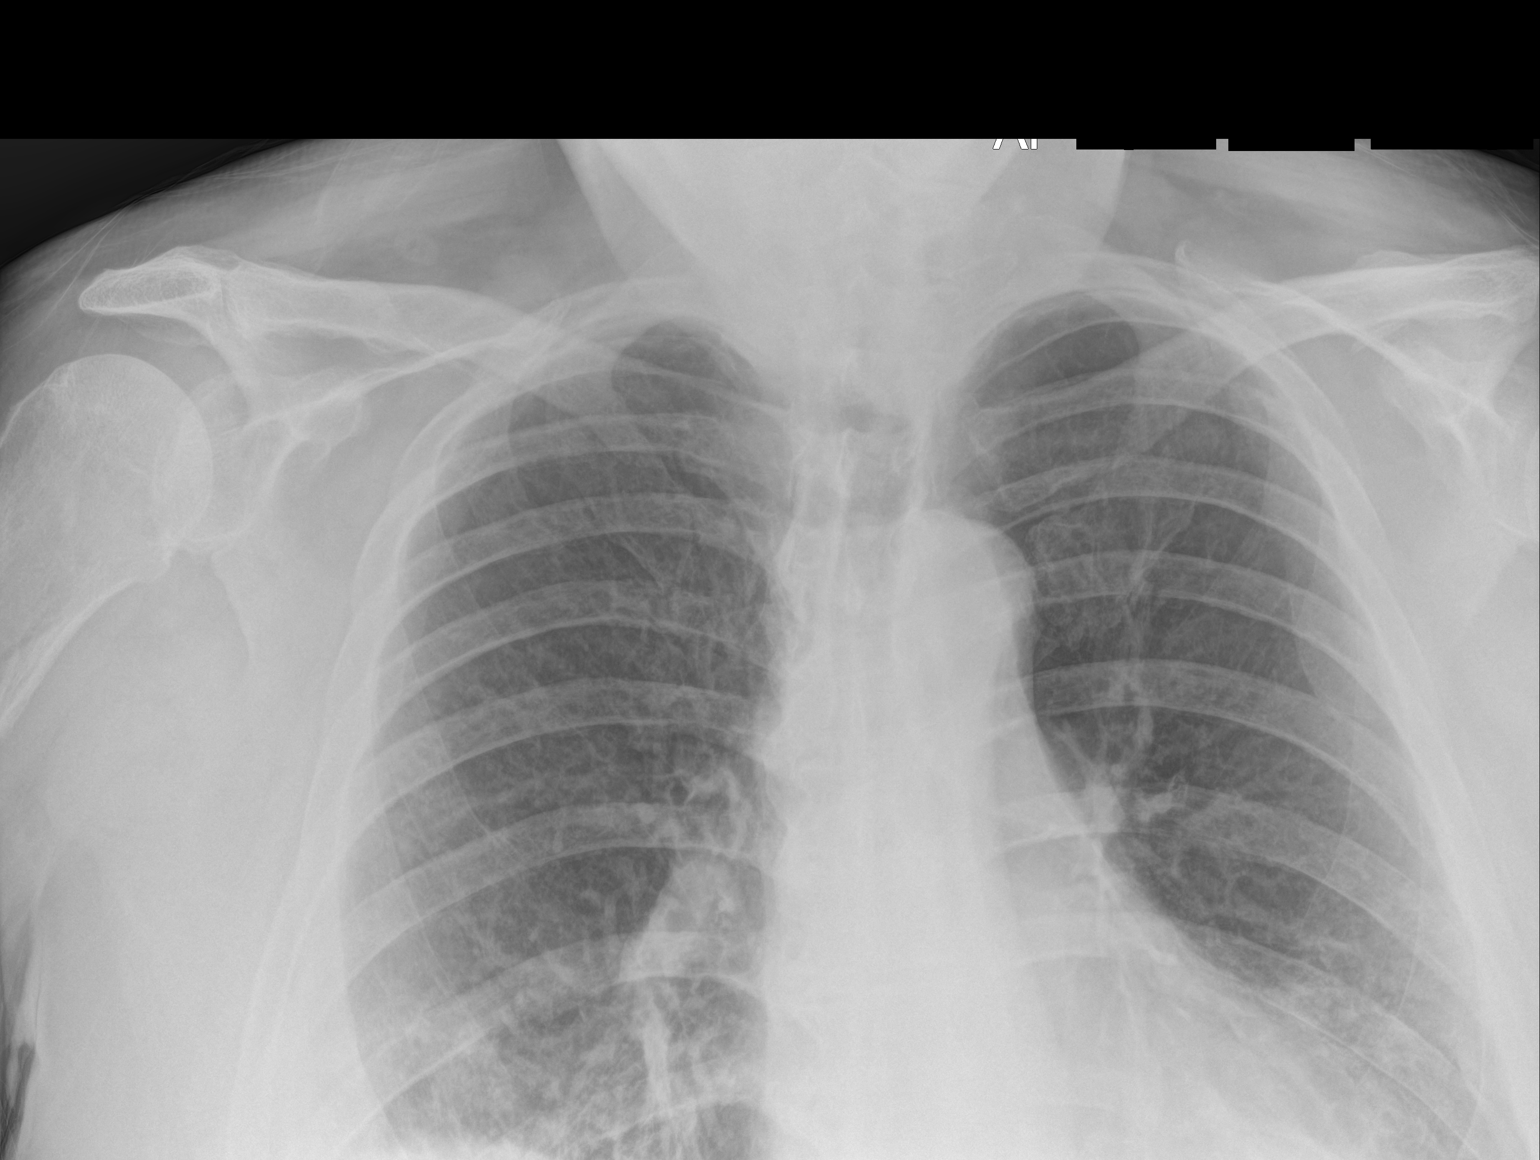
[im 2/2]
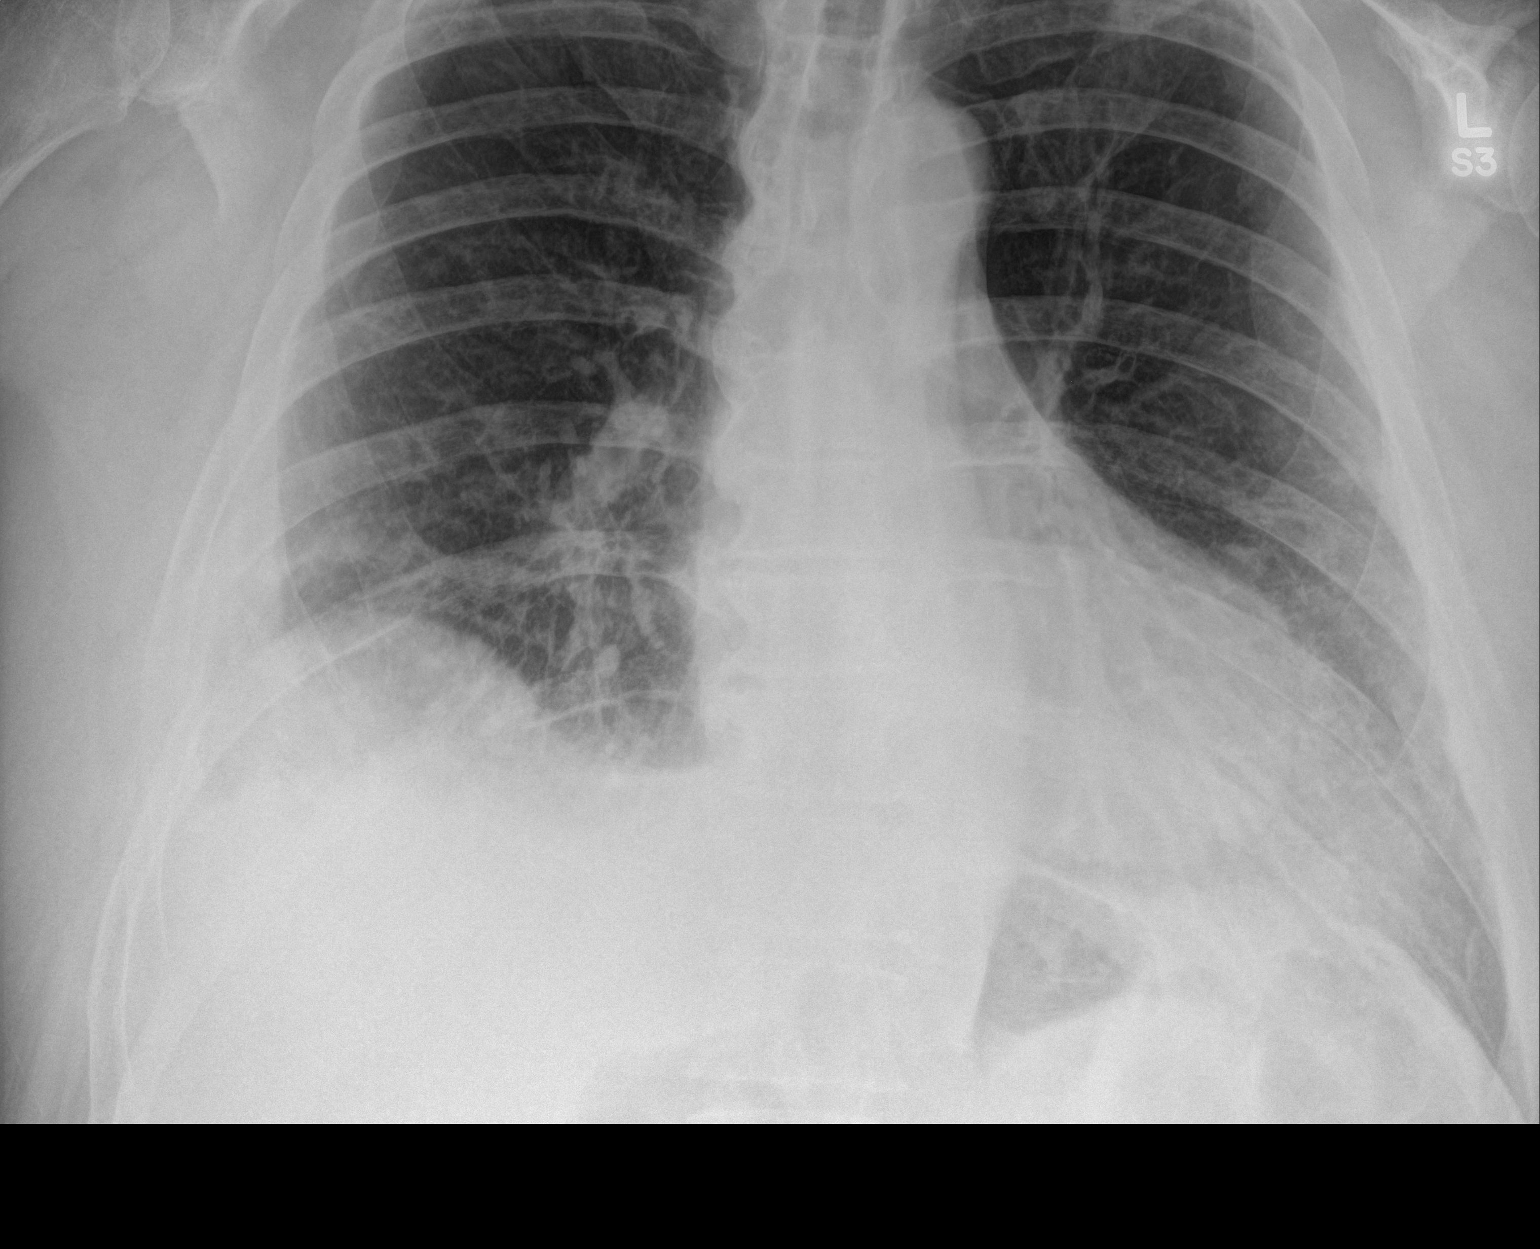

[2 of 2 positions shown; findings below may reference images not displayed]

FINDINGS: 2 frontal views. Both degraded by patient body habitus and AP
portable technique. Midline trachea. Cardiomegaly accentuated by AP
portable technique. No pneumothorax. No left-sided pleural fluid.
Pleural-parenchymal opacity at the inferior right hemi thorax. Right
hemidiaphragm elevation and probable eventration laterally. No gross
congestive failure. Possible patchy left base airspace disease.
IMPRESSION: Decreased sensitivity and specificity exam due to technique related
factors, as described above.

Pleural-parenchymal opacity in the inferior right hemi thorax. of
indeterminate acuity. At least partially felt to be chronic. Small
volume right-sided pleural fluid and adjacent airspace disease
cannot be excluded. Comparison with priors would be informative.
Also consider PA and lateral radiographs of possible.

Cannot exclude concurrent left base airspace disease.
